# Patient Record
Sex: Female | Born: 1955 | Race: White | Hispanic: No | Smoking: Former smoker
Health system: Southern US, Community
[De-identification: ages and names within clinical notes are randomized; demographics above are authoritative.]

## PROBLEM LIST (undated history)

## (undated) DIAGNOSIS — Z8632 Personal history of gestational diabetes: Secondary | ICD-10-CM

## (undated) DIAGNOSIS — G4730 Sleep apnea, unspecified: Secondary | ICD-10-CM

## (undated) DIAGNOSIS — R29818 Other symptoms and signs involving the nervous system: Secondary | ICD-10-CM

## (undated) DIAGNOSIS — E871 Hypo-osmolality and hyponatremia: Secondary | ICD-10-CM

## (undated) DIAGNOSIS — D1720 Benign lipomatous neoplasm of skin and subcutaneous tissue of unspecified limb: Secondary | ICD-10-CM

## (undated) DIAGNOSIS — R7301 Impaired fasting glucose: Secondary | ICD-10-CM

## (undated) DIAGNOSIS — T7840XA Allergy, unspecified, initial encounter: Secondary | ICD-10-CM

## (undated) DIAGNOSIS — E785 Hyperlipidemia, unspecified: Secondary | ICD-10-CM

## (undated) DIAGNOSIS — E049 Nontoxic goiter, unspecified: Secondary | ICD-10-CM

## (undated) DIAGNOSIS — K219 Gastro-esophageal reflux disease without esophagitis: Secondary | ICD-10-CM

## (undated) DIAGNOSIS — I10 Essential (primary) hypertension: Secondary | ICD-10-CM

## (undated) DIAGNOSIS — Z8781 Personal history of (healed) traumatic fracture: Secondary | ICD-10-CM

## (undated) HISTORY — DX: Nontoxic goiter, unspecified: E04.9

## (undated) HISTORY — DX: Other symptoms and signs involving the nervous system: R29.818

## (undated) HISTORY — DX: Hyperlipidemia, unspecified: E78.5

## (undated) HISTORY — DX: Sleep apnea, unspecified: G47.30

## (undated) HISTORY — DX: Hypo-osmolality and hyponatremia: E87.1

## (undated) HISTORY — DX: Impaired fasting glucose: R73.01

## (undated) HISTORY — DX: Allergy, unspecified, initial encounter: T78.40XA

## (undated) HISTORY — DX: Personal history of (healed) traumatic fracture: Z87.81

## (undated) HISTORY — PX: TONSILLECTOMY: SUR1361

## (undated) HISTORY — PX: DILATION AND CURETTAGE OF UTERUS: SHX78

## (undated) HISTORY — DX: Gastro-esophageal reflux disease without esophagitis: K21.9

## (undated) HISTORY — DX: Benign lipomatous neoplasm of skin and subcutaneous tissue of unspecified limb: D17.20

## (undated) HISTORY — DX: Personal history of gestational diabetes: Z86.32

## (undated) HISTORY — DX: Essential (primary) hypertension: I10

---

## 2000-06-21 ENCOUNTER — Encounter: Payer: Self-pay | Admitting: *Deleted

## 2000-06-21 ENCOUNTER — Encounter: Admission: RE | Admit: 2000-06-21 | Discharge: 2000-06-21 | Payer: Self-pay | Admitting: *Deleted

## 2001-03-31 ENCOUNTER — Other Ambulatory Visit: Admission: RE | Admit: 2001-03-31 | Discharge: 2001-03-31 | Payer: Self-pay | Admitting: *Deleted

## 2004-11-30 ENCOUNTER — Emergency Department (HOSPITAL_COMMUNITY): Admission: EM | Admit: 2004-11-30 | Discharge: 2004-11-30 | Payer: Self-pay | Admitting: Emergency Medicine

## 2005-03-13 ENCOUNTER — Encounter: Admission: RE | Admit: 2005-03-13 | Discharge: 2005-03-13 | Payer: Self-pay | Admitting: Internal Medicine

## 2005-07-21 ENCOUNTER — Other Ambulatory Visit: Admission: RE | Admit: 2005-07-21 | Discharge: 2005-07-21 | Payer: Self-pay | Admitting: Obstetrics and Gynecology

## 2006-08-10 IMAGING — CT CT CHEST W/O CM
1 of 3 series · 15 of 31 positions shown, 19 images · non-contrast
Comparison: none

CLINICAL DATA: Former smoker.  Quit three months ago with family history of lung cancer. 
 CT LUNG SCREENING:
TECHNIQUE: Multidetector CT imaging of the chest was performed following the standard protocol without IV contrast.

[Series 4: recon 3: screening chest w/o · axial · non-contrast · 0.70mm/px · z∈[-285,-16]mm · 15 of 243 slices shown, 19 images]
[im 14/243  mediastinal]
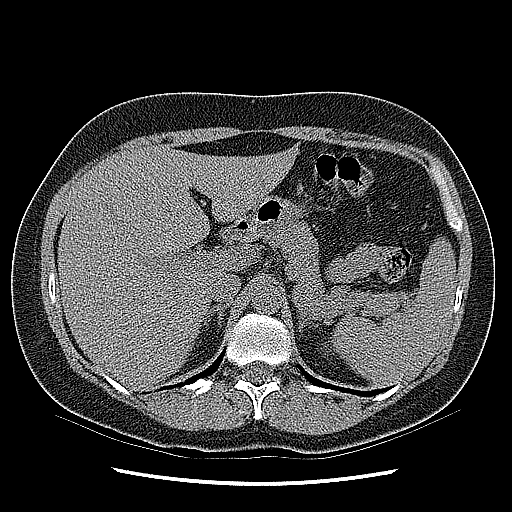
[im 14/243  lung]
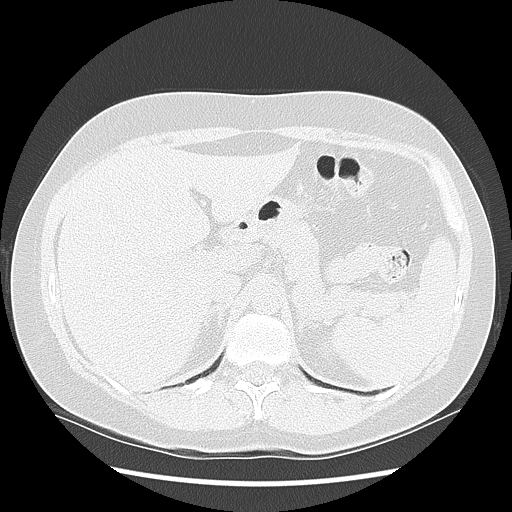
[im 27/243  lung]
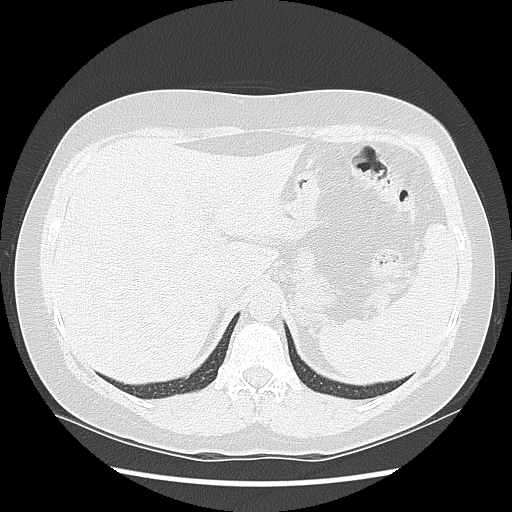
[im 54/243  lung]
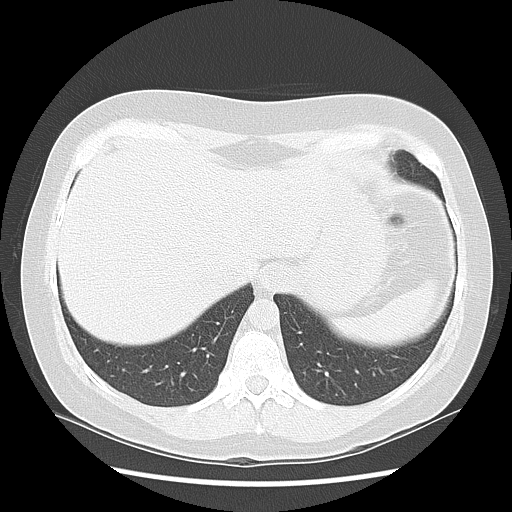
[im 68/243  lung]
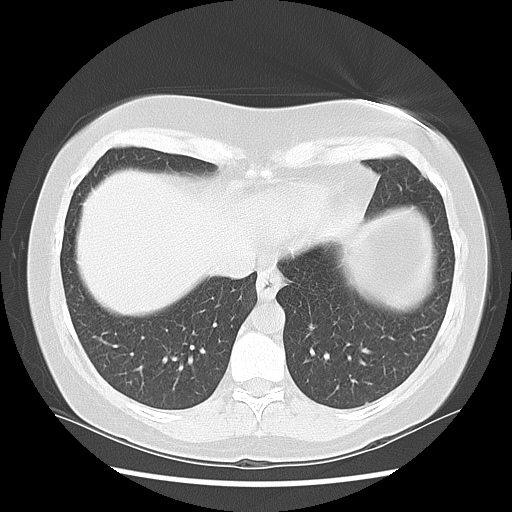
[im 81/243  mediastinal]
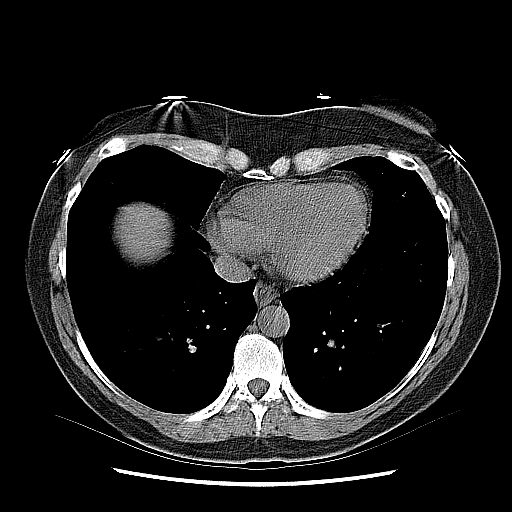
[im 81/243  lung]
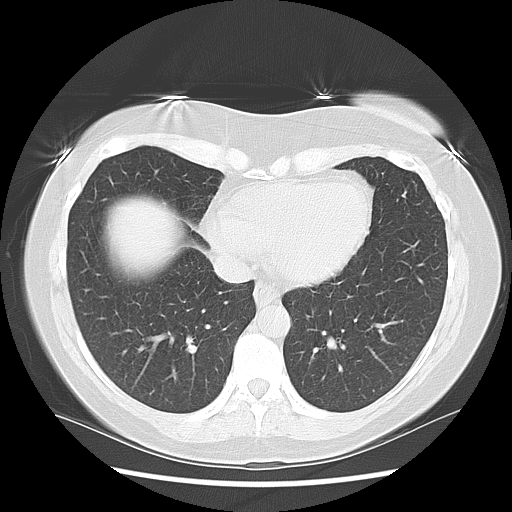
[im 95/243  lung]
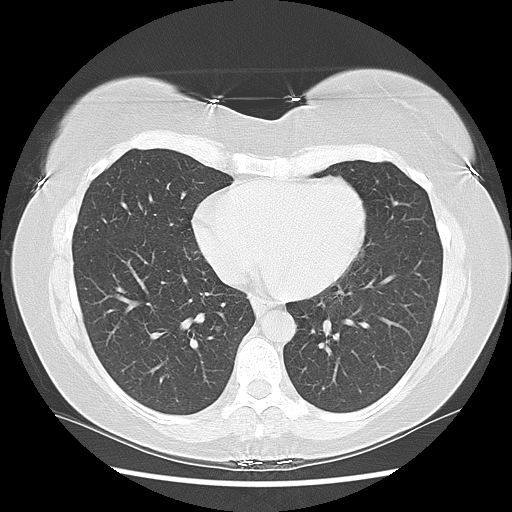
[im 107/243  lung]
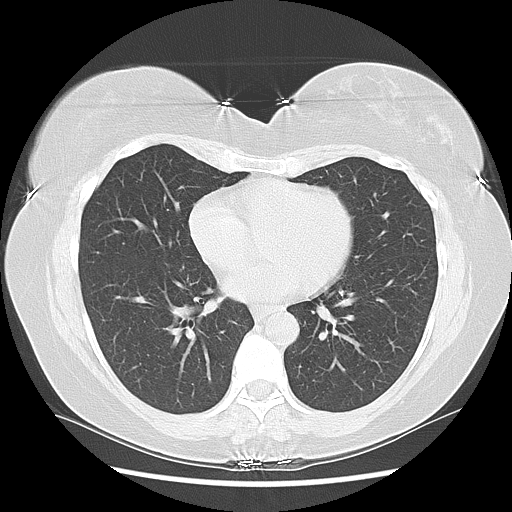
[im 122/243  lung]
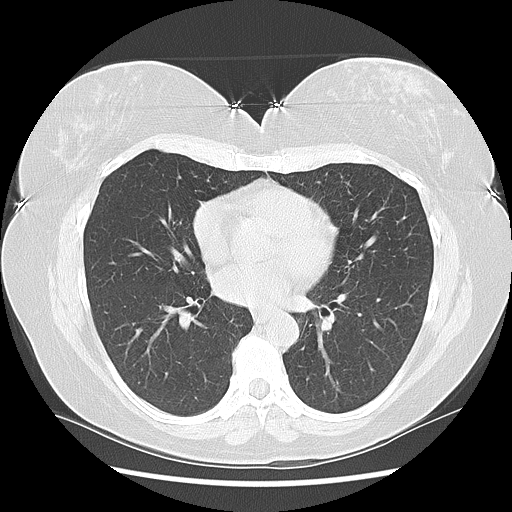
[im 135/243  mediastinal]
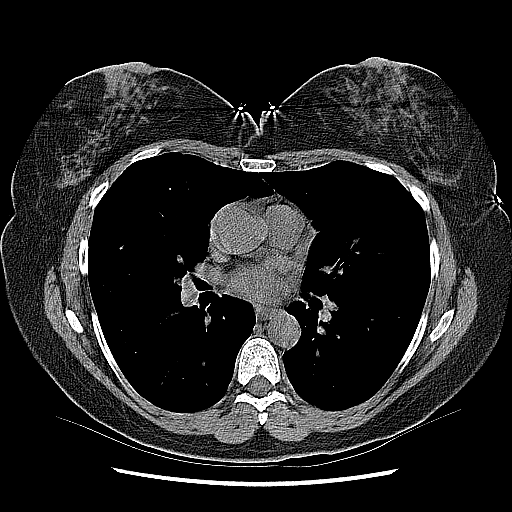
[im 135/243  lung]
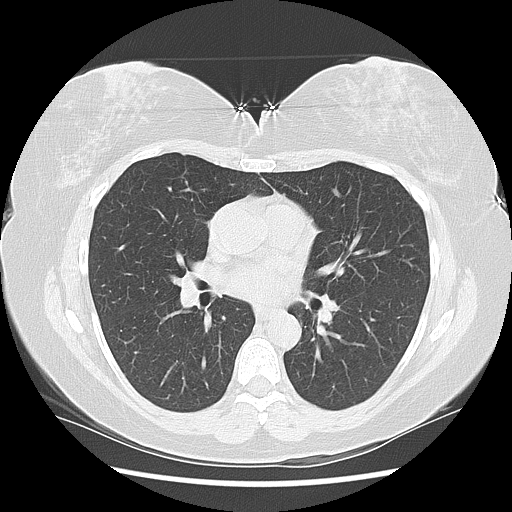
[im 148/243  lung]
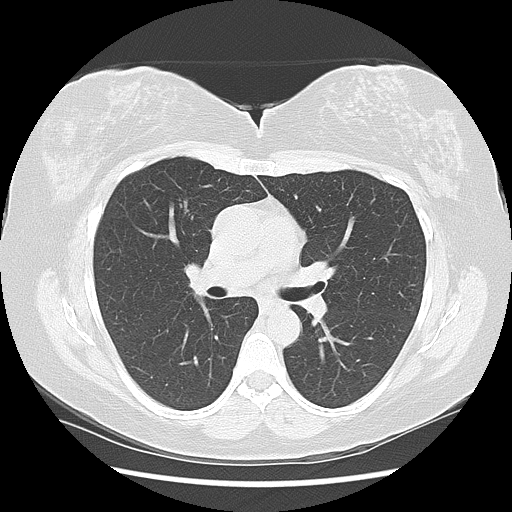
[im 162/243  lung]
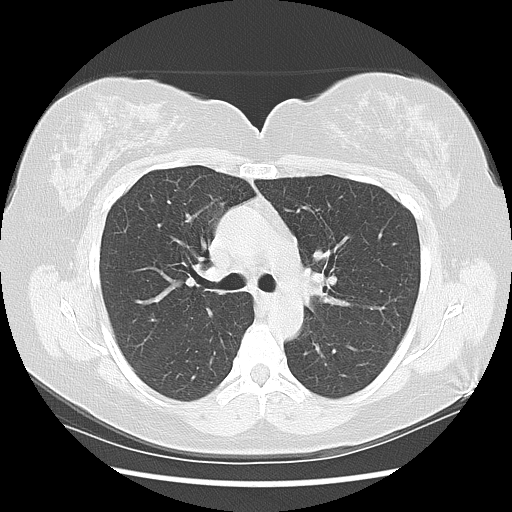
[im 175/243  lung]
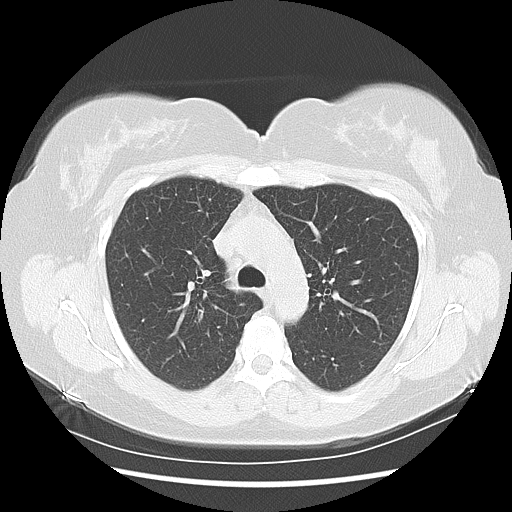
[im 202/243  mediastinal]
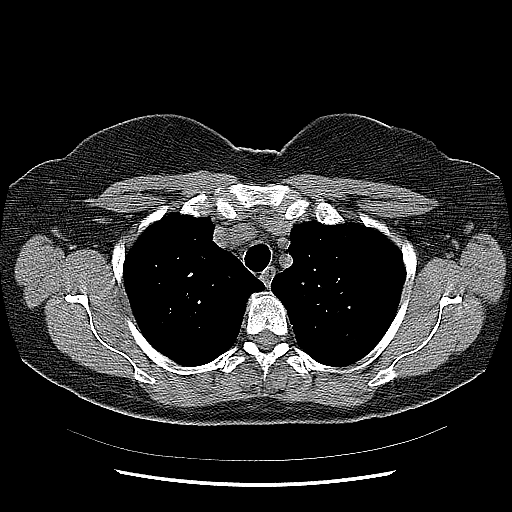
[im 202/243  lung]
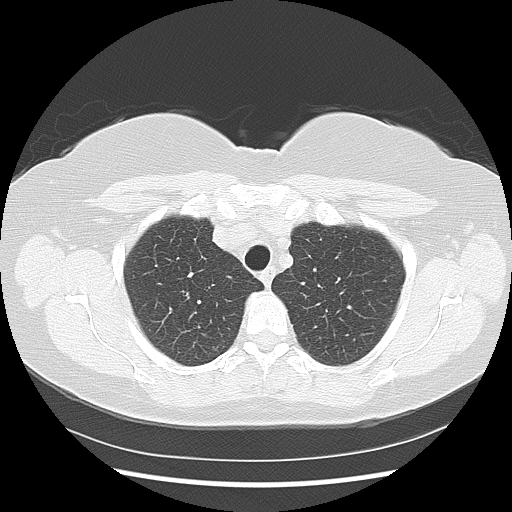
[im 216/243  lung]
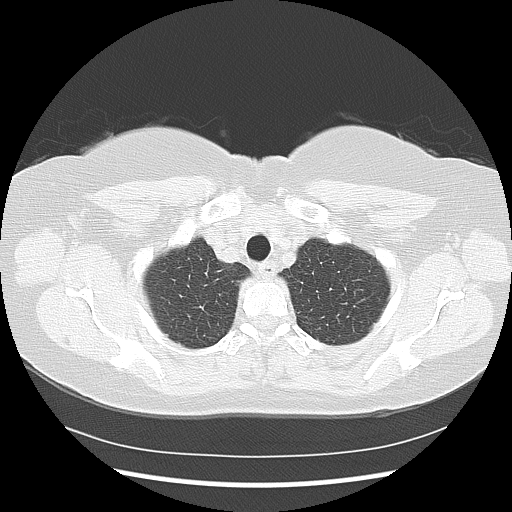
[im 229/243  lung]
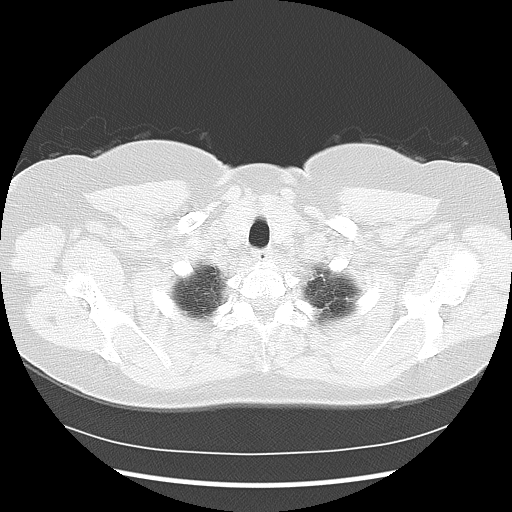

[15 of 31 positions shown; findings below may reference images not displayed]

FINDINGS: Area of curvilinear minimal scarring is seen at the anterior right middle lobe (images 36 ? 37) ? no further imaging follow-up deemed necessary.  The lungs are otherwise clear.  No significant mediastinal, hilar or axillary mass/adenopathy is seen with normal heart size.  Upper abdominal organs appear normal.  Old appearing ununited rib fracture (image 58) is seen at the right twelfth rib.
IMPRESSION: 1.  Ununited chronic appearing posterior right twelfth rib fracture deformity. 
 2.  Benign appearing slight fibrosis at the right middle lobe. 
 3.  Otherwise negative.

## 2007-01-18 ENCOUNTER — Other Ambulatory Visit: Admission: RE | Admit: 2007-01-18 | Discharge: 2007-01-18 | Payer: Self-pay | Admitting: Obstetrics and Gynecology

## 2007-10-03 LAB — HM MAMMOGRAPHY: HM Mammogram: NORMAL

## 2007-11-28 LAB — HM COLONOSCOPY

## 2009-01-14 ENCOUNTER — Other Ambulatory Visit: Admission: RE | Admit: 2009-01-14 | Discharge: 2009-01-14 | Payer: Self-pay | Admitting: Obstetrics and Gynecology

## 2010-03-06 ENCOUNTER — Other Ambulatory Visit: Admission: RE | Admit: 2010-03-06 | Discharge: 2010-03-06 | Payer: Self-pay | Admitting: Obstetrics and Gynecology

## 2010-05-12 ENCOUNTER — Encounter: Payer: Self-pay | Admitting: Obstetrics and Gynecology

## 2011-04-29 ENCOUNTER — Encounter: Payer: Self-pay | Admitting: *Deleted

## 2011-05-04 ENCOUNTER — Encounter: Payer: Self-pay | Admitting: Family Medicine

## 2011-05-04 ENCOUNTER — Ambulatory Visit (INDEPENDENT_AMBULATORY_CARE_PROVIDER_SITE_OTHER): Payer: 59 | Admitting: Family Medicine

## 2011-05-04 VITALS — BP 152/90 | HR 60 | Ht 65.0 in | Wt 169.0 lb

## 2011-05-04 DIAGNOSIS — I10 Essential (primary) hypertension: Secondary | ICD-10-CM | POA: Insufficient documentation

## 2011-05-04 DIAGNOSIS — Z8262 Family history of osteoporosis: Secondary | ICD-10-CM

## 2011-05-04 DIAGNOSIS — E049 Nontoxic goiter, unspecified: Secondary | ICD-10-CM | POA: Insufficient documentation

## 2011-05-04 DIAGNOSIS — F17200 Nicotine dependence, unspecified, uncomplicated: Secondary | ICD-10-CM | POA: Insufficient documentation

## 2011-05-04 DIAGNOSIS — Z23 Encounter for immunization: Secondary | ICD-10-CM

## 2011-05-04 DIAGNOSIS — Z8632 Personal history of gestational diabetes: Secondary | ICD-10-CM

## 2011-05-04 DIAGNOSIS — E7800 Pure hypercholesterolemia, unspecified: Secondary | ICD-10-CM | POA: Insufficient documentation

## 2011-05-04 NOTE — Progress Notes (Signed)
Chief complaint:  Patient presents to establish care.  Wants to get back on cholesterol and blood pressure medicine. Was having some neck pain since November but not currently  HPI: Patient presents to re-establish care.  Review of old records shows that I haven't seen her probably since 2009 (fall).  She saw Dr. Jillyn Hidden in October 2011, but wasn't taking meds at that time, no was she restarted on her medications.  Previously took meds for blood pressure and cholesterol.  HTN:  BP at work runs 135-138/75-90.  Previously used catapres patch, but had reaction to the adhesive, and was having dizziness in the mornings, which is why she stopped taking this medication.  Heartburn, seems to be related to gluten.  Since November, she had ongoing problems with heartburn.  She gave up all gluten on January 1st, feeling much better.  Lost weight, no further heartburn.  Hyperlipidemia--last check 01/2010 LDL 157, total chol 260, HDL 69, TG 165, ratio 3.79.  In the past, previously took Pravastatin with good results and no side effects. Has had dietary change since going gluten free January 1st.  Eats red meat every 2 weeks, 2 eggs/weekend. +cheese, but cut back significantly in January.  Past Medical History  Diagnosis Date  . Hypertension   . GERD (gastroesophageal reflux disease)   . Allergy   . Dyslipidemia     hypercholesterolemia  . Lipoma of axilla     h/o left  . 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 Hx gestational diabetes   . Low sodium levels     history of low sodium with diuretics in the past.  . Goiter     u/s 07/2004, generalized enlargement without cysts or nodules; normal TSH    Past Surgical History  Procedure Date  . Tonsillectomy     History   Social History  . Marital Status: Divorced    Spouse Name: N/A    Number of Children: 1  . Years of Education: N/A   Occupational History  . architect    Social History Main Topics    . Smoking status: Current Everyday Smoker -- 0.1 packs/day for 41 years    Types: Cigarettes  . Smokeless tobacco: Not on file   Comment: 1 cigarette daily, at bedtime  . Alcohol Use: Yes     2-3 glasses of red wine per day.  . Drug Use: No  . Sexually Active: Not on file   Other Topics Concern  . Not on file   Social History Narrative   Lives with her 102 year old son; no pets    Family History  Problem Relation Age of Onset  . Osteoporosis Mother   . Cancer Father 46    osteosarcoma, metastasized to lung  . Diabetes Maternal Uncle   . Cancer Paternal Uncle 50    colon  . Cancer Maternal Grandfather     throat  . Breast cancer Paternal Grandmother 54  . Crohn's disease Cousin   . Psoriasis Cousin     Current outpatient prescriptions:aspirin 81 MG tablet, Take 160 mg by mouth daily., Disp: , Rfl: ;  Calcium Carbonate (CALCIUM 600 PO), Take 1 tablet by mouth daily., Disp: , Rfl: ;  Cholecalciferol (VITAMIN D) 1000 UNITS capsule, Take 1,000 Units by mouth daily., Disp: , Rfl: ;  Multiple Vitamin (ANTI-OXIDANT PO), Take 1 tablet by mouth daily., Disp: , Rfl:  Multiple Vitamins-Minerals (ANTIOXIDANT VITAMINS PO),  Take 1 tablet by mouth daily., Disp: , Rfl: ;  vitamin C (ASCORBIC ACID) 500 MG tablet, Take 500 mg by mouth daily., Disp: , Rfl: ;  vitamin E 100 UNIT capsule, Take 100 Units by mouth daily., Disp: , Rfl:   Allergies  Allergen Reactions  . Codeine Other (See Comments)    Would make her start her period. Also just affected her body poorly so she just does not take it.  Marland Kitchen Hctz (Hydrochlorothiazide) Other (See Comments)    Hyponatremia   . Penicillins Rash   ROS: Denies headaches, dizziness, chest pain, palpitations.  Reflux has resolved, bowels are normal.  Denies fatigue.  Sore throat over the weekend with some swollen glands, feeling better today.  No sneezing, fevers, cough. Denies urinary complaints, joint complaints, skin rashes, bleeding, or other  problems.  Had colonoscopy at age 83.  Due for mammogram, and sees Dr. Gerald Leitz for her GYN, last fall 2011.  Had CPE with Dr. Jillyn Hidden in 01/2010  PHYSICAL EXAM: BP 152/90  Pulse 60  Ht 5\' 5"  (1.651 m)  Wt 169 lb (76.658 kg)  BMI 28.12 kg/m2 Well developed, pleasant female, in no distress HEENT:  PERRL, EOMI, conjunctiva clear, OP clear Neck: mildly enlarged thyroid, without mass or nodules Heart: regular rate and rhythm without murmur Lungs: clear bilaterally Back: no spine or CVA tenderness Abdomen: soft, nontender, no organomegaly or mass Extremities: no edema, 2+ pulse Skin: no rash Psych: normal mood, affect, hygiene and grooming  ASSESSMENT/PLAN:  1. Need for prophylactic vaccination and inoculation against influenza  Flu vaccine greater than or equal to 3yo preservative free IM  2. History of gestational diabetes  Hemoglobin A1c  3. Pure hypercholesterolemia  Lipid panel  4. Essential hypertension, benign  Comprehensive metabolic panel  5. Goiter  TSH  6. Family history of osteoporosis  Vitamin D 25 hydroxy  7. Tobacco use disorder     HTN--Keep BP log.  Fax/mail in 1 month.  Low sodium diet and daily exercise reviewed. Hyperlipidemia--given recent dietary changes, will wait at least 4-6 weeks before checking labs.  Restart pravastatin only if indicated.  Schedule CPE for 2 months, with labs prior.  F/u sooner if BP's remain elevated.  They seem to be lower at home on a regular basis than what it was here today.

## 2011-05-04 NOTE — Patient Instructions (Addendum)
-Keep BP log.  Fax/mail in 1 month.  Low sodium diet and daily exercise Please quit smoking   2 Gram Low Sodium Diet A 2 gram sodium diet restricts the amount of sodium in the diet to no more than 2 g or 2000 mg daily. Limiting the amount of sodium is often used to help lower blood pressure. It is important if you have heart, liver, or kidney problems. Many foods contain sodium for flavor and sometimes as a preservative. When the amount of sodium in a diet needs to be low, it is important to know what to look for when choosing foods and drinks. The following includes some information and guidelines to help make it easier for you to adapt to a low sodium diet. QUICK TIPS  Do not add salt to food.   Avoid convenience items and fast food.   Choose unsalted snack foods.   Buy lower sodium products, often labeled as "lower sodium" or "no salt added."   Check food labels to learn how much sodium is in 1 serving.   When eating at a restaurant, ask that your food be prepared with less salt or none, if possible.  READING FOOD LABELS FOR SODIUM INFORMATION The nutrition facts label is a good place to find how much sodium is in foods. Look for products with no more than 500 to 600 mg of sodium per meal and no more than 150 mg per serving. Remember that 2 g = 2000 mg. The food label may also list foods as:  Sodium-free: Less than 5 mg in a serving.   Very low sodium: 35 mg or less in a serving.   Low-sodium: 140 mg or less in a serving.   Light in sodium: 50% less sodium in a serving. For example, if a food that usually has 300 mg of sodium is changed to become light in sodium, it will have 150 mg of sodium.   Reduced sodium: 25% less sodium in a serving. For example, if a food that usually has 400 mg of sodium is changed to reduced sodium, it will have 300 mg of sodium.  CHOOSING FOODS Grains  Avoid: Salted crackers and snack items. Some cereals, including instant hot cereals. Bread stuffing  and biscuit mixes. Seasoned rice or pasta mixes.   Choose: Unsalted snack items. Low-sodium cereals, oats, puffed wheat and rice, shredded wheat. English muffins and bread. Pasta.  Meats  Avoid: Salted, canned, smoked, spiced, pickled meats, including fish and poultry. Bacon, ham, sausage, cold cuts, hot dogs, anchovies.   Choose: Low-sodium canned tuna and salmon. Fresh or frozen meat, poultry, and fish.  Dairy  Avoid: Processed cheese and spreads. Cottage cheese. Buttermilk and condensed milk. Regular cheese.   Choose: Milk. Low-sodium cottage cheese. Yogurt. Sour cream. Low-sodium cheese.  Fruits and Vegetables  Avoid: Regular canned vegetables. Regular canned tomato sauce and paste. Frozen vegetables in sauces. Olives. Rosita Fire. Relishes. Sauerkraut.   Choose: Low-sodium canned vegetables. Low-sodium tomato sauce and paste. Frozen or fresh vegetables. Fresh and frozen fruit.  Condiments  Avoid: Canned and packaged gravies. Worcestershire sauce. Tartar sauce. Barbecue sauce. Soy sauce. Steak sauce. Ketchup. Onion, garlic, and table salt. Meat flavorings and tenderizers.   Choose: Fresh and dried herbs and spices. Low-sodium varieties of mustard and ketchup. Lemon juice. Tabasco sauce. Horseradish.  SAMPLE 2 GRAM SODIUM MEAL PLAN Breakfast / Sodium (mg)  1 cup low-fat milk / 143 mg   2 slices whole-wheat toast / 270 mg   1 tbs  heart-healthy margarine / 153 mg   1 hard-boiled egg / 139 mg   1 small orange / 0 mg  Lunch / Sodium (mg)  1 cup raw carrots / 76 mg    cup hummus / 298 mg   1 cup low-fat milk / 143 mg    cup red grapes / 2 mg   1 whole-wheat pita bread / 356 mg  Dinner / Sodium (mg)  1 cup whole-wheat pasta / 2 mg   1 cup low-sodium tomato sauce / 73 mg   3 oz lean ground beef / 57 mg   1 small side salad (1 cup raw spinach leaves,  cup cucumber,  cup yellow bell pepper) with 1 tsp olive oil and 1 tsp red wine vinegar / 25 mg  Snack / Sodium  (mg)  1 container low-fat vanilla yogurt / 107 mg   3 graham cracker squares / 127 mg  Nutrient Analysis  Calories: 2033   Protein: 77 g   Carbohydrate: 282 g   Fat: 72 g   Sodium: 1971 mg  Document Released: 04/06/2005 Document Revised: 12/17/2010 Document Reviewed: 07/08/2009 [facility] Patient Information 2012 [place], [place].

## 2011-06-30 ENCOUNTER — Other Ambulatory Visit: Payer: 59

## 2011-07-02 ENCOUNTER — Encounter: Payer: 59 | Admitting: Family Medicine

## 2011-08-03 ENCOUNTER — Other Ambulatory Visit: Payer: 59

## 2011-08-05 ENCOUNTER — Encounter: Payer: 59 | Admitting: Family Medicine

## 2012-03-30 ENCOUNTER — Telehealth: Payer: Self-pay | Admitting: Family Medicine

## 2012-04-01 NOTE — Telephone Encounter (Signed)
Patient still has future lab orders in system (ordered 04/2011)--these, however, will expire in January.  Please renew (change expiration date) so orders will still be in system prior to her CPE (a year late)

## 2012-04-04 NOTE — Telephone Encounter (Signed)
Orders extended.

## 2012-04-20 LAB — HM MAMMOGRAPHY: HM Mammogram: NORMAL

## 2012-05-30 ENCOUNTER — Other Ambulatory Visit: Payer: 59

## 2012-05-30 DIAGNOSIS — E7800 Pure hypercholesterolemia, unspecified: Secondary | ICD-10-CM

## 2012-05-30 DIAGNOSIS — Z8632 Personal history of gestational diabetes: Secondary | ICD-10-CM

## 2012-05-30 DIAGNOSIS — I10 Essential (primary) hypertension: Secondary | ICD-10-CM

## 2012-05-30 DIAGNOSIS — E049 Nontoxic goiter, unspecified: Secondary | ICD-10-CM

## 2012-05-30 LAB — COMPREHENSIVE METABOLIC PANEL
BUN: 14 mg/dL (ref 6–23)
CO2: 24 mEq/L (ref 19–32)
Calcium: 9.8 mg/dL (ref 8.4–10.5)
Creat: 0.73 mg/dL (ref 0.50–1.10)
Glucose, Bld: 113 mg/dL — ABNORMAL HIGH (ref 70–99)
Total Bilirubin: 0.5 mg/dL (ref 0.3–1.2)

## 2012-05-30 LAB — LIPID PANEL
Cholesterol: 270 mg/dL — ABNORMAL HIGH (ref 0–200)
HDL: 70 mg/dL (ref >39)
Triglycerides: 195 mg/dL — ABNORMAL HIGH (ref <150)

## 2012-05-30 LAB — HEMOGLOBIN A1C
Hgb A1c MFr Bld: 5.6 % (ref <5.7)
Mean Plasma Glucose: 114 mg/dL (ref <117)

## 2012-06-01 ENCOUNTER — Encounter: Payer: 59 | Admitting: Family Medicine

## 2012-06-01 ENCOUNTER — Encounter: Payer: Self-pay | Admitting: Family Medicine

## 2012-06-01 ENCOUNTER — Ambulatory Visit (INDEPENDENT_AMBULATORY_CARE_PROVIDER_SITE_OTHER): Payer: 59 | Admitting: Family Medicine

## 2012-06-01 VITALS — BP 160/80 | HR 80 | Temp 98.1°F | Ht 66.0 in | Wt 170.0 lb

## 2012-06-01 DIAGNOSIS — K219 Gastro-esophageal reflux disease without esophagitis: Secondary | ICD-10-CM | POA: Insufficient documentation

## 2012-06-01 DIAGNOSIS — F17200 Nicotine dependence, unspecified, uncomplicated: Secondary | ICD-10-CM

## 2012-06-01 DIAGNOSIS — E7800 Pure hypercholesterolemia, unspecified: Secondary | ICD-10-CM

## 2012-06-01 DIAGNOSIS — I10 Essential (primary) hypertension: Secondary | ICD-10-CM

## 2012-06-01 DIAGNOSIS — Z0000 Encounter for general adult medical examination without abnormal findings: Secondary | ICD-10-CM

## 2012-06-01 DIAGNOSIS — R7301 Impaired fasting glucose: Secondary | ICD-10-CM

## 2012-06-01 LAB — POCT URINALYSIS DIPSTICK
Bilirubin, UA: NEGATIVE
Glucose, UA: NEGATIVE
Ketones, UA: NEGATIVE
Leukocytes, UA: NEGATIVE
Nitrite, UA: NEGATIVE

## 2012-06-01 NOTE — Progress Notes (Signed)
Chief Complaint  Patient presents with  . Annual Exam    nonfasting annual exam, sees Dr.Gretchen Renaldo Fiddler (appt next Mon). Would like flu shot, does have some upper respiratory issues going on so I wasn't sure if she could have. No major concerns. Labs done 05/30/12. Did not do vision as she just had eye exam.   Laura Payne is a 57 y.o. female who presents for a complete physical.  She has the following concerns:  Follow up on labs done prior to appointment.  She admits to eating cheese with most meals. Only has red meat about once a week or every 2 weeks. Cooks with olive oil.  Drinks 2-3 glasses of wine daily.  Has cut back on her Diet coke intake.  URI:  Started 3 days ago with malaise, head congestion, sinus pressure, ear pressure.  Nasal mucus is clear. Feeling better today than yesterday. BP's at home run 130/85 at home.  Immunization History  Administered Date(s) Administered  . Influenza Split 05/04/2011  . Tdap 10/19/2007   Last Pap smear: 1.5 years ago, has scheduled Last mammogram: last month Last colonoscopy: age 63 Last DEXA:  Never Dentist: twice yearly Ophtho: yearly Exercise:  On weekends (2x/week).  Past Medical History  Diagnosis Date  . Hypertension   . GERD (gastroesophageal reflux disease)   . Allergy   . Dyslipidemia     hypercholesterolemia  . Lipoma of axilla     h/o left  . 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 Hx gestational diabetes   . Low sodium levels     history of low sodium with diuretics in the past.  . Goiter     u/s 07/2004, generalized enlargement without cysts or nodules; normal TSH    Past Surgical History  Procedure Laterality Date  . Tonsillectomy      History   Social History  . Marital Status: Divorced    Spouse Name: N/A    Number of Children: 1  . Years of Education: N/A   Occupational History  . architect    Social History Main Topics  . Smoking status: Current  Every Day Smoker -- 0.10 packs/day for 41 years    Types: Cigarettes  . Smokeless tobacco: Not on file     Comment: 1 cigarette daily, at bedtime  . Alcohol Use: Yes     Comment: 2-3 glasses of red wine per day.  . Drug Use: No  . Sexually Active: Not Currently   Other Topics Concern  . Not on file   Social History Narrative   Lives with her 70 year old son; no pets    Family History  Problem Relation Age of Onset  . Osteoporosis Mother   . Dementia Mother   . Cancer Father 70    osteosarcoma, metastasized to lung  . Diabetes Maternal Uncle   . Cancer Paternal Uncle 50    colon  . Cancer Maternal Grandfather     throat  . Breast cancer Paternal Grandmother 50  . Crohn's disease Cousin   . Psoriasis Cousin   . Arthritis Cousin     rheumatoid    Current outpatient prescriptions:aspirin 81 MG tablet, Take 81 mg by mouth daily. , Disp: , Rfl: ;  Cholecalciferol (VITAMIN D) 1000 UNITS capsule, Take 1,000 Units by mouth daily., Disp: , Rfl: ;  Multiple Vitamin (ANTI-OXIDANT PO), Take 1 tablet by mouth daily., Disp: ,  Rfl: ;  ranitidine (ZANTAC) 150 MG tablet, Take 150 mg by mouth as needed for heartburn., Disp: , Rfl:  vitamin C (ASCORBIC ACID) 500 MG tablet, Take 500 mg by mouth daily., Disp: , Rfl: ;  vitamin E 100 UNIT capsule, Take 100 Units by mouth daily., Disp: , Rfl: ;  Calcium Carbonate (CALCIUM 600 PO), Take 1 tablet by mouth daily., Disp: , Rfl:   Allergies  Allergen Reactions  . Codeine Other (See Comments)    Would make her start her period. Also just affected her body poorly so she just does not take it.  Marland Kitchen Hctz (Hydrochlorothiazide) Other (See Comments)    Hyponatremia   . Latex Rash  . Penicillins Rash    ROS:  The patient denies anorexia, weight changes,vision changes, decreased hearing, ear pain, breast concerns, chest pain, dizziness, syncope, dyspnea on exertion, swelling, nausea, vomiting, diarrhea, constipation, abdominal pain, melena, hematochezia,  hematuria, incontinence, dysuria, vaginal bleeding, discharge, odor or itch, genital lesions, joint pains, numbness, tingling, weakness, tremor, suspicious skin lesions, depression, anxiety, abnormal bleeding/bruising, or enlarged lymph nodes.  +URI, no fever (see HPI) Arms sometimes ache (x 1 year), some numbness with certain sleep positions. +heartburn every 3 days, relieved by zantac 150 Noticing some palpitations recently--? If because of her mother's afib, anxiety/worry about this and about her heart. Postmenopausal, no bleeding, +hot flashes (improved)  PHYSICAL EXAM: BP 160/80  Pulse 80  Temp(Src) 98.1 F (36.7 C) (Oral)  Ht 5\' 6"  (1.676 m)  Wt 170 lb (77.111 kg)  BMI 27.45 kg/m2  General Appearance:    Alert, cooperative, no distress, appears stated age  Head:    Normocephalic, without obvious abnormality, atraumatic  Eyes:    PERRL, conjunctiva/corneas clear, EOM's intact, fundi    benign  Ears:    Normal TM's and external ear canals  Nose:   Nares normal, mucosa moderately edematous, no erythema, no purulent drainage; mild maxillary sinus tenderness  Throat:   Lips, mucosa, and tongue normal; teeth and gums normal  Neck:   Supple, no lymphadenopathy;  thyroid:  no   enlargement/tenderness/nodules; no carotid   bruit or JVD  Back:    Spine nontender, no curvature, ROM normal, no CVA     tenderness  Lungs:     Clear to auscultation bilaterally without wheezes, rales or     ronchi; respirations unlabored  Chest Wall:    No tenderness or deformity   Heart:    Regular rate and rhythm, S1 and S2 normal, no murmur, rub   or gallop  Breast Exam:    Deferred to GYN  Abdomen:     Soft, non-tender, nondistended, normoactive bowel sounds,    no masses, no hepatosplenomegaly  Genitalia:    Deferred to GYN     Extremities:   No clubbing, cyanosis or edema  Pulses:   2+ and symmetric all extremities  Skin:   Skin color, texture, turgor normal, no rashes or lesions  Lymph nodes:    Cervical, supraclavicular, and axillary nodes normal  Neurologic:   CNII-XII intact, normal strength, sensation and gait; reflexes 2+ and symmetric throughout          Psych:   Normal mood, affect, hygiene and grooming.    Lab Results  Component Value Date   HGBA1C 5.6 05/30/2012     Chemistry      Component Value Date/Time   NA 141 05/30/2012 0952   K 4.2 05/30/2012 0952   CL 107 05/30/2012 1610  CO2 24 05/30/2012 0952   BUN 14 05/30/2012 0952   CREATININE 0.73 05/30/2012 0952      Component Value Date/Time   CALCIUM 9.8 05/30/2012 0952   ALKPHOS 73 05/30/2012 0952   AST 16 05/30/2012 0952   ALT 25 05/30/2012 0952   BILITOT 0.5 05/30/2012 0952     Glucose 113  Lab Results  Component Value Date   CHOL 270* 05/30/2012   HDL 70 05/30/2012   LDLCALC 161* 05/30/2012   TRIG 195* 05/30/2012   CHOLHDL 3.9 05/30/2012    ASSESSMENT/PLAN:  1. Routine general medical examination at a health care facility  POCT Urinalysis Dipstick   POCT Urinalysis Dipstick   CANCELED: Visual acuity screening  2. Pure hypercholesterolemia  Lipid panel   Lipid panel  3. Tobacco use disorder    4. Essential hypertension, benign    5. Impaired fasting glucose  Hemoglobin A1c   Hemoglobin A1c   Glucose, random  6. GERD (gastroesophageal reflux disease)     Labs reviewed in detail with patient.  Dyslipidemia--counseled regarding diet at length.  Decrease cheese and wine intake, low cholesterol diet reviewed. Impaired fasting glucose--counseled regarding diet, exercise, weight loss  URI--neti-pot/sinus rinses, mucinex, decongestants sparingly with BP monitored vs coricidin.  No evidence of bacterial infection currently. GERD--counseled re: risks of untreated reflux, and behavioral/dietary modifications, as well as medications to treat.  Pneumovax recommended for smokers.  Will give in 6 months at f/u visit (unless has quit by then).  Elevated blood pressure--continue monitoring at home.  Bring list of BP's  (and monitor) to next visit.  Return sooner if BP's remain >140/90.  Exercise, weight loss and low sodium diet.  Discussed monthly self breast exams and yearly mammograms after the age of 33; at least 30 minutes of aerobic activity at least 5 days/week; proper sunscreen use reviewed; healthy diet, including goals of calcium and vitamin D intake and alcohol recommendations (less than or equal to 1 drink/day) reviewed; regular seatbelt use; changing batteries in smoke detectors.  Immunization recommendations discussed--pneumovax, flu shots yearly (not given today due to illness, and toward end of season, to get in the fall).  Colonoscopy recommendations reviewed, UTD   F/u 6 months with fasting labs prior

## 2012-06-01 NOTE — Patient Instructions (Addendum)
HEALTH MAINTENANCE RECOMMENDATIONS:  It is recommended that you get at least 30 minutes of aerobic exercise at least 5 days/week (for weight loss, you may need as much as 60-90 minutes). This can be any activity that gets your heart rate up. This can be divided in 10-15 minute intervals if needed, but try and build up your endurance at least once a week.  Weight bearing exercise is also recommended twice weekly.  Eat a healthy diet with lots of vegetables, fruits and fiber.  "Colorful" foods have a lot of vitamins (ie green vegetables, tomatoes, red peppers, etc).  Limit sweet tea, regular sodas and alcoholic beverages, all of which has a lot of calories and sugar.  Up to 1 alcoholic drink daily may be beneficial for women (unless trying to lose weight, watch sugars).  Drink a lot of water.  Calcium recommendations are 1200-1500 mg daily (1500 mg for postmenopausal women or women without ovaries), and vitamin D 1000 IU daily.  This should be obtained from diet and/or supplements (vitamins), and calcium should not be taken all at once, but in divided doses.  Monthly self breast exams and yearly mammograms for women over the age of 109 is recommended.  Sunscreen of at least SPF 30 should be used on all sun-exposed parts of the skin when outside between the hours of 10 am and 4 pm (not just when at beach or pool, but even with exercise, golf, tennis, and yard work!)  Use a sunscreen that says "broad spectrum" so it covers both UVA and UVB rays, and make sure to reapply every 1-2 hours.  Remember to change the batteries in your smoke detectors when changing your clock times in the spring and fall.  Use your seat belt every time you are in a car, and please drive safely and not be distracted with cell phones and texting while driving.  Cut back on alcohol, cheese.  Exercise daily, weight loss recommended.  Plan to recheck labs in 6 months.  URI/sinus infection--neti-pot/sinus rinses, mucinex,  decongestants sparingly with BP monitored vs coricidin.  Diet for Gastroesophageal Reflux Disease, Adult Reflux (acid reflux) is when acid from your stomach flows up into the esophagus. When acid comes in contact with the esophagus, the acid causes irritation and soreness (inflammation) in the esophagus. When reflux happens often or so severely that it causes damage to the esophagus, it is called gastroesophageal reflux disease (GERD). Nutrition therapy can help ease the discomfort of GERD. FOODS OR DRINKS TO AVOID OR LIMIT  Smoking or chewing tobacco. Nicotine is one of the most potent stimulants to acid production in the gastrointestinal tract.  Caffeinated and decaffeinated coffee and black tea.  Regular or low-calorie carbonated beverages or energy drinks (caffeine-free carbonated beverages are allowed).   Strong spices, such as black pepper, white pepper, red pepper, cayenne, curry powder, and chili powder.  Peppermint or spearmint.  Chocolate.  High-fat foods, including meats and fried foods. Extra added fats including oils, butter, salad dressings, and nuts. Limit these to less than 8 tsp per day.  Fruits and vegetables if they are not tolerated, such as citrus fruits or tomatoes.  Alcohol.  Any food that seems to aggravate your condition. If you have questions regarding your diet, call your caregiver or a registered dietitian. OTHER THINGS THAT MAY HELP GERD INCLUDE:   Eating your meals slowly, in a relaxed setting.  Eating 5 to 6 small meals per day instead of 3 large meals.  Eliminating food for a  period of time if it causes distress.  Not lying down until 3 hours after eating a meal.  Keeping the head of your bed raised 6 to 9 inches (15 to 23 cm) by using a foam wedge or blocks under the legs of the bed. Lying flat may make symptoms worse.  Being physically active. Weight loss may be helpful in reducing reflux in overweight or obese adults.  Wear loose fitting  clothing EXAMPLE MEAL PLAN This meal plan is approximately 2,000 calories based on https://www.[facility].org/ meal planning guidelines. Breakfast   cup cooked oatmeal.  1 cup strawberries.  1 cup low-fat milk.  1 oz almonds. Snack  1 cup cucumber slices.  6 oz yogurt (made from low-fat or fat-free milk). Lunch  2 slice whole-wheat bread.  2 oz sliced [place].  2 tsp mayonnaise.  1 cup blueberries.  1 cup snap peas. Snack  6 whole-wheat crackers.  1 oz string cheese. Dinner   cup brown rice.  1 cup mixed veggies.  1 tsp olive oil.  3 oz grilled fish. Document Released: 04/06/2005 Document Revised: 06/29/2011 Document Reviewed: 02/20/2011 [facility] Patient Information 2013 [place], [place].

## 2012-06-04 ENCOUNTER — Other Ambulatory Visit: Payer: Self-pay

## 2012-11-21 ENCOUNTER — Other Ambulatory Visit: Payer: No Typology Code available for payment source

## 2012-11-21 DIAGNOSIS — R7301 Impaired fasting glucose: Secondary | ICD-10-CM

## 2012-11-21 DIAGNOSIS — E7800 Pure hypercholesterolemia, unspecified: Secondary | ICD-10-CM

## 2012-11-21 LAB — LIPID PANEL
Cholesterol: 255 mg/dL — ABNORMAL HIGH (ref 0–200)
Triglycerides: 280 mg/dL — ABNORMAL HIGH (ref <150)

## 2012-11-22 LAB — HEMOGLOBIN A1C: Mean Plasma Glucose: 108 mg/dL (ref <117)

## 2012-11-24 ENCOUNTER — Encounter: Payer: Self-pay | Admitting: Family Medicine

## 2012-11-24 ENCOUNTER — Ambulatory Visit (INDEPENDENT_AMBULATORY_CARE_PROVIDER_SITE_OTHER): Payer: No Typology Code available for payment source | Admitting: Family Medicine

## 2012-11-24 VITALS — BP 154/84 | HR 84 | Ht 65.0 in | Wt 174.0 lb

## 2012-11-24 DIAGNOSIS — R7301 Impaired fasting glucose: Secondary | ICD-10-CM

## 2012-11-24 DIAGNOSIS — I10 Essential (primary) hypertension: Secondary | ICD-10-CM

## 2012-11-24 DIAGNOSIS — F1010 Alcohol abuse, uncomplicated: Secondary | ICD-10-CM

## 2012-11-24 DIAGNOSIS — F17200 Nicotine dependence, unspecified, uncomplicated: Secondary | ICD-10-CM

## 2012-11-24 DIAGNOSIS — E7800 Pure hypercholesterolemia, unspecified: Secondary | ICD-10-CM

## 2012-11-24 MED ORDER — LISINOPRIL 10 MG PO TABS: 10.0000 mg | ORAL_TABLET | Freq: Every day | ORAL | Status: DC

## 2012-11-24 NOTE — Patient Instructions (Addendum)
Reduce wine intake to just 1 glass per day.  If you feel you cannot control this, then only drink socially, and do not have wine in the house.  Develop other relaxation techniques (yoga, mediation, crocheting).  Try and exercise at least 30 minutes daily.  Cutting back on the wine will help you lose weight, reduce triglycerides, reduce blood sugar, and potentially reduce blood pressure.  Try taking omega-3 fish oil 3000 mg daily.  This also helps lower triglycerides.  Start lisinopril 10mg  once daily.  Continue low sodium diet.  Call us if you develop a terrible cough, and can't tolerate the medication  Bring list of BP's to visit (and monitor to be checked, if desired). Will do EKG at next appointment

## 2012-11-24 NOTE — Progress Notes (Signed)
Chief Complaint  Patient presents with  . Follow-up    med check. States that she may need bp meds and/or chol medication.   Hypertension follow-up:  Blood pressures were only checked a few times, and ranged in the high 140's/90's. She thinks she needs to be on medication (again) as they are remaining high.  She tried clonidine patch (when also having a lot of hot flashes) and thinks HCTZ in the past, recall having problems with low potassium.  Denies dizziness, headaches. Sometimes will get a fleeting, short-lived pain in the chest while driving, sometimes at night when she can't fall asleep.  Denies any exertional chest pain.  Not getting any regular exercise. Not bothered by reflux much  Tobacco use:  She is only smoking 1 cigarette/week.  She doesn't buy cigarettes, bums them off of friends, when she is around other smokers.  She is drinking 2-3 glasses of wine/night.  Drinks every evening in her house, and socially.  She is caring for her mother. She is going to her appointments with her, and sees "how I might end up" so feels more motivated to make changes, and start medications if needed.  Past Medical History  Diagnosis Date  . Hypertension   . GERD (gastroesophageal reflux disease)   . Allergy   . Dyslipidemia     hypercholesterolemia  . Lipoma of axilla     h/o left  . 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 Hx gestational diabetes   . Low sodium levels     history of low sodium with diuretics in the past.  . Goiter     u/s 07/2004, generalized enlargement without cysts or nodules; normal TSH   Past Surgical History  Procedure Laterality Date  . Tonsillectomy     History   Social History  . Marital Status: Divorced    Spouse Name: N/A    Number of Children: 1  . Years of Education: N/A   Occupational History  . architect    Social History Main Topics  . Smoking status: Current Some Day Smoker -- 0.10 packs/day for 41  years    Types: Cigarettes  . Smokeless tobacco: Not on file     Comment: 1 cigarette daily, at bedtime  . Alcohol Use: Yes     Comment: 2-3 glasses of red wine per day.  . Drug Use: No  . Sexually Active: Not Currently   Other Topics Concern  . Not on file   Social History Narrative   Lives with her son; no pets   Current outpatient prescriptions:aspirin 81 MG tablet, Take 81 mg by mouth daily. , Disp: , Rfl: ;  Calcium Carbonate (CALCIUM 600 PO), Take 1 tablet by mouth daily., Disp: , Rfl: ;  Cholecalciferol (VITAMIN D) 1000 UNITS capsule, Take 1,000 Units by mouth daily., Disp: , Rfl: ;  Multiple Vitamin (ANTI-OXIDANT PO), Take 1 tablet by mouth daily., Disp: , Rfl: ;  vitamin C (ASCORBIC ACID) 500 MG tablet, Take 500 mg by mouth daily., Disp: , Rfl:  vitamin E 100 UNIT capsule, Take 100 Units by mouth daily., Disp: , Rfl: ;  ranitidine (ZANTAC) 150 MG tablet, Take 150 mg by mouth as needed for heartburn., Disp: , Rfl:   Allergies  Allergen Reactions  . Codeine Other (See Comments)    Would make her start her period. Also just affected her body poorly so she just does not take  it.  . Hctz (Hydrochlorothiazide) Other (See Comments)    Hyponatremia   . Latex Rash  . Penicillins Rash   ROS: denies fevers, URI symptoms, cough, shortness of breath, exertional chest pain, swelling, GI complaints, GU complaints, depression, joint pains, skin rashes, bleeding/bruising or other concerns.  See HPI.  PHYSICAL EXAM: BP 154/100  Pulse 84  Ht 5\' 5"  (1.651 m)  Wt 174 lb (78.926 kg)  BMI 28.96 kg/m2 154/84 on repeat by MD, RA Well developed, pleasant, overweight female in no distress Neck: mild diffuse enlargement of thyroid, no nodule/mass or tenderness.  No lymphadenopathy Heart: regular rate and rhythm without murmur Lungs: clear bilaterally Back: no spine or CVA tenderness Abdomen: soft, nontender, no organomegaly or mass Extremities: no edema Skin: no rash/lesions Psych: normal  mood, affect, hygiene and grooming Neuro: alert and oriented.  Cranial nerves grossly intact. Normal strength, gait.  Labs: Glucose 107 Lab Results  Component Value Date   HGBA1C 5.4 11/21/2012   Lab Results  Component Value Date   CHOL 255* 11/21/2012   HDL 68 11/21/2012   LDLCALC 161* 11/21/2012   TRIG 280* 11/21/2012   CHOLHDL 3.8 11/21/2012   ASSESSMENT/PLAN:  Impaired fasting glucose - reviewed diet, exercise, weight loss.  needs to decrease alcohol intake  Essential hypertension, benign - start lisinopril.  risks and side effects reviewed.  cut in 1/2 if dizziness or BP too low.  call if develops cough - Plan: lisinopril (PRINIVIL,ZESTRIL) 10 MG tablet  Pure hypercholesterolemia - LDL improved.  TG remains elevated.  needs to cut back on alcohol intake.  consider taking fish oil.  no meds for now--recheck in 3-6 months.   Tobacco use disorder - counseled re: ways to stop smoking in social situations.  encouraged complete cessation.  congratulated on her hard work so far  Alcohol abuse, daily use - counseled extensively re: risks of alcohol use.  counseled re: stress reduction techniques.  no more than 1 glass/day (5 oz) recommended.    Reduce wine intake to just 1 glass per day.  If you feel you cannot control this, then only drink socially, and do not have wine in the house.  Develop other relaxation techniques (yoga, mediation, crocheting).  Try and exercise at least 30 minutes daily.  Cutting back on the wine will help you lose weight, reduce triglycerides, reduce blood sugar, and potentially reduce blood pressure.  F/u 3-4 weeks on HTN.  Bring list of BP's to visit (and monitor to be checked, if desired). Will do EKG at next appointment (declined EKG today due to time constraint)--get as baseline, due to HTN  40 minute visit, more than 1/2 spent counseling

## 2012-11-25 DIAGNOSIS — F1010 Alcohol abuse, uncomplicated: Secondary | ICD-10-CM | POA: Insufficient documentation

## 2012-12-28 ENCOUNTER — Ambulatory Visit: Payer: No Typology Code available for payment source | Admitting: Family Medicine

## 2012-12-29 ENCOUNTER — Ambulatory Visit: Payer: No Typology Code available for payment source | Admitting: Family Medicine

## 2013-01-02 ENCOUNTER — Ambulatory Visit: Payer: No Typology Code available for payment source | Admitting: Family Medicine

## 2013-01-21 ENCOUNTER — Other Ambulatory Visit: Payer: Self-pay | Admitting: Family Medicine

## 2013-02-19 ENCOUNTER — Other Ambulatory Visit: Payer: Self-pay | Admitting: Family Medicine

## 2013-02-23 ENCOUNTER — Other Ambulatory Visit: Payer: Self-pay

## 2013-03-25 ENCOUNTER — Other Ambulatory Visit: Payer: Self-pay | Admitting: Family Medicine

## 2013-04-05 ENCOUNTER — Encounter: Payer: Self-pay | Admitting: Family Medicine

## 2013-04-05 ENCOUNTER — Ambulatory Visit (INDEPENDENT_AMBULATORY_CARE_PROVIDER_SITE_OTHER): Payer: No Typology Code available for payment source | Admitting: Family Medicine

## 2013-04-05 VITALS — BP 134/78 | HR 88 | Ht 65.0 in | Wt 171.0 lb

## 2013-04-05 DIAGNOSIS — Z79899 Other long term (current) drug therapy: Secondary | ICD-10-CM

## 2013-04-05 DIAGNOSIS — Z23 Encounter for immunization: Secondary | ICD-10-CM

## 2013-04-05 DIAGNOSIS — I10 Essential (primary) hypertension: Secondary | ICD-10-CM

## 2013-04-05 DIAGNOSIS — F17200 Nicotine dependence, unspecified, uncomplicated: Secondary | ICD-10-CM

## 2013-04-05 DIAGNOSIS — E781 Pure hyperglyceridemia: Secondary | ICD-10-CM | POA: Insufficient documentation

## 2013-04-05 DIAGNOSIS — R7301 Impaired fasting glucose: Secondary | ICD-10-CM

## 2013-04-05 LAB — BASIC METABOLIC PANEL
BUN: 12 mg/dL (ref 6–23)
Chloride: 101 mEq/L (ref 96–112)
Potassium: 4.4 mEq/L (ref 3.5–5.3)

## 2013-04-05 MED ORDER — LISINOPRIL 10 MG PO TABS: ORAL_TABLET | ORAL | Status: DC

## 2013-04-05 NOTE — Progress Notes (Signed)
Chief Complaint  Patient presents with  . Hypertension    follow up-EKG at visit per last note per Dr.Analeigha Nauman.    Patient presents for f/u on HTN.  She was started on lisinopril back in August, and she was due to return in 3-4 weeks.  She is compliant with taking her medication daily, denies cough or other side effects.  Denies headaches, dizziness, chest pain.  Checking BP periodically, and it is running 128-134/mid-80's.  She is exercising 45 minutes (fitness blender program, on YouTube--cardio and strength training) 4x/week. She is sleeping better at night.  She has reduced her wine intake from 2-3 glasses down to 1-2 glasses of wine every night.  Hypertriglyceridemia--she ran out of fish oil 2 months ago.  She had been taking it BID.  She started sprinkling flax seed on her food, which had helped with her hot flashes. But then she started having diarrhea, and joint pains (in both knees, arms)--worrying it could be fibromyalgia.  Symptoms all improved after stopping the flax seed.  Past Medical History  Diagnosis Date  . Hypertension   . GERD (gastroesophageal reflux disease)   . Allergy   . Dyslipidemia     hypercholesterolemia  . Lipoma of axilla     h/o left  . 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 Hx gestational diabetes   . Low sodium levels     history of low sodium with diuretics in the past.  . Goiter     u/s 07/2004, generalized enlargement without cysts or nodules; normal TSH   Past Surgical History  Procedure Laterality Date  . Tonsillectomy     History   Social History  . Marital Status: Divorced    Spouse Name: N/A    Number of Children: 1  . Years of Education: N/A   Occupational History  . architect    Social History Main Topics  . Smoking status: Current Some Day Smoker -- 0.10 packs/day for 41 years    Types: Cigarettes  . Smokeless tobacco: Not on file     Comment: 1 cigarette daily, at bedtime  .  Alcohol Use: Yes     Comment: 2-3 glasses of red wine per day.  . Drug Use: No  . Sexual Activity: Not Currently   Other Topics Concern  . Not on file   Social History Narrative   Lives with her son; no pets   Current outpatient prescriptions:aspirin 81 MG tablet, Take 81 mg by mouth daily. , Disp: , Rfl: ;  Calcium Carbonate (CALCIUM 600 PO), Take 1 tablet by mouth daily., Disp: , Rfl: ;  Cholecalciferol (VITAMIN D) 1000 UNITS capsule, Take 1,000 Units by mouth daily., Disp: , Rfl: ;  lisinopril (PRINIVIL,ZESTRIL) 10 MG tablet, TAKE 1 TABLET BY MOUTH DAILY, Disp: 90 tablet, Rfl: 1 Multiple Vitamin (ANTI-OXIDANT PO), Take 1 tablet by mouth daily., Disp: , Rfl: ;  ranitidine (ZANTAC) 150 MG tablet, Take 150 mg by mouth as needed for heartburn., Disp: , Rfl: ;  vitamin C (ASCORBIC ACID) 500 MG tablet, Take 500 mg by mouth daily., Disp: , Rfl: ;  vitamin E 100 UNIT capsule, Take 100 Units by mouth daily., Disp: , Rfl:   Allergies  Allergen Reactions  . Codeine Other (See Comments)    Would make her start her period. Also just affected her body poorly so she just does not take it.  Marland Kitchen Hctz [Hydrochlorothiazide] Other (See  Comments)    Hyponatremia   . Latex Rash  . Penicillins Rash   ROS:  Denies fevers, chills, URI symptoms, headaches, dizziness, chest pain, edema, GI complaints. She has lost 3 pounds. She denies cough, shortness of breath, or other concerns.  Moods are good.  PHYSICAL EXAM: BP 130/86  Pulse 88  Ht 5\' 5"  (1.651 m)  Wt 171 lb (77.565 kg)  BMI 28.46 kg/m2 134/78 on repeat by MD, RA Pleasant female in no distress Neck: no lymphadenopathy, thyromegaly or carotid bruit Heart: regular rate and rhythm without murmur Lungs: clear bilaterally Extremities: no edema  EKG:  NSR rate 68.  Borderline LAE.  ?q in V2 vs poor lead placement. No acute changes noted.  This is a baseline test, for new diagnosis of HTN.  There is no LVH  ASSESSMENT/PLAN:  Essential hypertension,  benign - Plan: Basic metabolic panel, Lipid panel, lisinopril (PRINIVIL,ZESTRIL) 10 MG tablet  Need for prophylactic vaccination and inoculation against influenza - Plan: Flu Vaccine QUAD 36+ mos IM  HTN (hypertension) - Plan: EKG 12-Lead  Tobacco use disorder  Encounter for long-term (current) use of other medications  Impaired fasting glucose - Plan: Hemoglobin A1c, Glucose, random  Hypertriglyceridemia - Plan: Lipid panel  b-met today Return after holidays for lipids, glucose (early February) Restart fish oil--take 3000-4000mg  of omega-3 fish oil daily. Continue regular exercise--try for at least 5-6 days/week  Encouraged complete cessation of smoking

## 2013-04-05 NOTE — Patient Instructions (Signed)
Return after holidays for fasting bloodwork--lipids, glucose Restart fish oil--take 3000-4000mg  of omega-3 fish oil daily. (if that causes side effects, then go back to the 2/day you previously took) Continue regular exercise--try for at least 5-6 days/week.  Try Estroven (black cohosh and soy) for hot flashes  Please avoid ALL cigarettes, after the New Year.

## 2013-04-24 ENCOUNTER — Encounter: Payer: Self-pay | Admitting: Family Medicine

## 2013-04-26 ENCOUNTER — Other Ambulatory Visit: Payer: Self-pay | Admitting: *Deleted

## 2013-04-26 MED ORDER — ESOMEPRAZOLE MAGNESIUM 40 MG PO CPDR: 40.0000 mg | DELAYED_RELEASE_CAPSULE | Freq: Every day | ORAL | Status: DC

## 2013-05-02 ENCOUNTER — Encounter: Payer: Self-pay | Admitting: Internal Medicine

## 2013-05-02 LAB — HM MAMMOGRAPHY: HM Mammogram: NEGATIVE

## 2013-09-26 ENCOUNTER — Other Ambulatory Visit: Payer: Self-pay | Admitting: Family Medicine

## 2013-10-16 ENCOUNTER — Other Ambulatory Visit: Payer: Self-pay | Admitting: *Deleted

## 2013-10-16 MED ORDER — ESOMEPRAZOLE MAGNESIUM 40 MG PO CPDR: DELAYED_RELEASE_CAPSULE | ORAL | Status: DC

## 2013-11-02 ENCOUNTER — Other Ambulatory Visit: Payer: Self-pay

## 2013-11-06 ENCOUNTER — Ambulatory Visit (INDEPENDENT_AMBULATORY_CARE_PROVIDER_SITE_OTHER): Payer: 59 | Admitting: Family Medicine

## 2013-11-06 ENCOUNTER — Encounter: Payer: Self-pay | Admitting: Family Medicine

## 2013-11-06 VITALS — BP 130/72 | HR 72 | Ht 65.0 in | Wt 173.0 lb

## 2013-11-06 DIAGNOSIS — K219 Gastro-esophageal reflux disease without esophagitis: Secondary | ICD-10-CM

## 2013-11-06 DIAGNOSIS — R7301 Impaired fasting glucose: Secondary | ICD-10-CM

## 2013-11-06 DIAGNOSIS — E782 Mixed hyperlipidemia: Secondary | ICD-10-CM

## 2013-11-06 DIAGNOSIS — E781 Pure hyperglyceridemia: Secondary | ICD-10-CM

## 2013-11-06 DIAGNOSIS — I10 Essential (primary) hypertension: Secondary | ICD-10-CM

## 2013-11-06 LAB — GLUCOSE, RANDOM: Glucose, Bld: 112 mg/dL — ABNORMAL HIGH (ref 70–99)

## 2013-11-06 LAB — LIPID PANEL
CHOLESTEROL: 248 mg/dL — AB (ref 0–200)
HDL: 65 mg/dL (ref >39)
LDL CALC: 135 mg/dL — AB (ref 0–99)
TRIGLYCERIDES: 240 mg/dL — AB (ref <150)
Total CHOL/HDL Ratio: 3.8 Ratio
VLDL: 48 mg/dL — ABNORMAL HIGH (ref 0–40)

## 2013-11-06 LAB — POCT GLYCOSYLATED HEMOGLOBIN (HGB A1C): Hemoglobin A1C: 5.4

## 2013-11-06 MED ORDER — LISINOPRIL 10 MG PO TABS: ORAL_TABLET | ORAL | Status: DC

## 2013-11-06 MED ORDER — ESOMEPRAZOLE MAGNESIUM 40 MG PO CPDR: 40.0000 mg | DELAYED_RELEASE_CAPSULE | Freq: Every day | ORAL | Status: DC

## 2013-11-06 NOTE — Patient Instructions (Signed)
Continue your current medications.  Continue lowfat, low cholesterol diet, and low sodium diet. Continue to check BP periodically, and return if they are running >140/90 Continue regular exercise.  Try and limit alcohol to just 1 glass/night Congratulations on quitting smoking  We will contact you with cholesterol results.  If the triglycerides are still >150, then starting omega-3 fish oil 3000-4000mg  daily might help.

## 2013-11-06 NOTE — Progress Notes (Signed)
Chief Complaint  Patient presents with  . Hypertension    nonfasting med check, labs were this am. Doing A1C right now.    Patient presents for follow up on hypertension.  She was started on lisinopril almost a year ago. Blood pressures elsewhere are 128-135/70-85.  Denies dizziness, headaches, chest pain.  Denies side effects of medications, cough.  She is staying active--some walking and swimming, Chief Strategy Officer (YouTube video)--30-45 minutes 3-4x/week (includes weights).  She increased her exercise, reduced her wine, and reduced her food intake in order to prevent the weight gain from quitting smoking (happened when she quit in the past).  She quit 1 month ago, and no significant weight gain.  Hyperlipidemia:  In past LDL and TG have been elevated.  She has not been taking fish oil lately.  Didn't tolerate flax seed (GI issues). Has been eating healthier, cut back some on wine intake as above.  GERD:  Well controlled with Nexium.  No longer needing zantac prn (as she did before). She has recurrent symptoms ("projectile vomiting") if she misses a dose of Nexium.  Past Medical History  Diagnosis Date  . Hypertension   . GERD (gastroesophageal reflux disease)   . Allergy   . Dyslipidemia     hypercholesterolemia  . Lipoma of axilla     h/o left  . 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 Hx gestational diabetes   . Low sodium levels     history of low sodium with diuretics in the past.  . Goiter     u/s 07/2004, generalized enlargement without cysts or nodules; normal TSH   Past Surgical History  Procedure Laterality Date  . Tonsillectomy     History   Social History  . Marital Status: Divorced    Spouse Name: N/A    Number of Children: 1  . Years of Education: N/A   Occupational History  . architect    Social History Main Topics  . Smoking status: Former Smoker -- 0.10 packs/day for 41 years    Types: Cigarettes    Quit date:  10/07/2013  . Smokeless tobacco: Never Used  . Alcohol Use: Yes     Comment: 1.5-2 glasses of wine every evening (6/7 days usually)  . Drug Use: No  . Sexual Activity: Not Currently   Other Topics Concern  . Not on file   Social History Narrative   Lives alone--sone moved to [place], but he will be moving back in with her soon (while attending [employer]); no pets   Family History  Problem Relation Age of Onset  . Osteoporosis Mother   . Dementia Mother   . Transient ischemic attack Mother   . Hypertension Mother   . Heart disease Mother     atrial fibrillation  . Atrial fibrillation Mother   . Cancer Father 28    osteosarcoma, metastasized to lung  . Diabetes Maternal Uncle   . Cancer Paternal Uncle 62    colon  . Cancer Maternal Grandfather     throat  . Breast cancer Paternal Grandmother 58  . Crohn's disease Cousin   . Psoriasis Cousin   . Arthritis Cousin     rheumatoid  . Diabetes Cousin     type 1   Outpatient Encounter Prescriptions as of 11/06/2013  Medication Sig Note  . aspirin 81 MG tablet Take 81 mg by mouth daily.    . Calcium Carbonate (CALCIUM  600 PO) Take 1 tablet by mouth daily. 11/24/2012: Takes about 5 days a week  . Cholecalciferol (VITAMIN D) 1000 UNITS capsule Take 1,000 Units by mouth daily. 11/24/2012: Takes 5 days a week  . esomeprazole (NEXIUM) 40 MG capsule Take 1 capsule (40 mg total) by mouth daily before supper.   Marland Kitchen lisinopril (PRINIVIL,ZESTRIL) 10 MG tablet TAKE 1 TABLET BY MOUTH DAILY   . Multiple Vitamin (ANTI-OXIDANT PO) Take 1 tablet by mouth daily. 11/06/2013: Takes daily  . NON FORMULARY Take 1 capsule by mouth daily. 11/06/2013: TUMERIC 500mg   . vitamin C (ASCORBIC ACID) 500 MG tablet Take 500 mg by mouth daily.   . vitamin E 100 UNIT capsule Take 100 Units by mouth daily.   . [DISCONTINUED] esomeprazole (NEXIUM) 40 MG capsule TAKE ONE CAPSULE BY MOUTH EVERY DAY AT 12:00 PM   . [DISCONTINUED] lisinopril (PRINIVIL,ZESTRIL) 10 MG tablet TAKE 1  TABLET BY MOUTH DAILY   . [DISCONTINUED] ranitidine (ZANTAC) 150 MG tablet Take 150 mg by mouth as needed for heartburn. 06/01/2012: Takes 1 tablet as needed, usually every 3 days   Allergies  Allergen Reactions  . Codeine Other (See Comments)    Would make her start her period. Also just affected her body poorly so she just does not take it.  Marland Kitchen Hctz [Hydrochlorothiazide] Other (See Comments)    Hyponatremia   . Latex Rash  . Penicillins Rash   ROS:  No fevers, chills, headaches, dizziness, chest pain, shortness of breath, URI symptoms. No significant weight changes (up 2 pounds since December) No thyroid symptoms--energy normal, no hair/skin/bowel/mood changes.  She does not some cracking in both thumb nails, denies trauma. Sneezing frequently at work; wakes up congested in the mornings. No depression/anxiety, GU complaints or other concerns, except as per HPI.  PHYSICAL EXAM: BP 130/72  Pulse 72  Ht 5\' 5"  (1.651 m)  Wt 173 lb (78.472 kg)  BMI 28.79 kg/m2  Pleasant female in no distress  Neck: no lymphadenopathy, thyromegaly or carotid bruit  Heart: regular rate and rhythm without murmur  Lungs: clear bilaterally  Abdomen: soft, nontender, no organomegaly or mass Back: no CVA tenderness Extremities: no edema, 2+ pulses Neuro: alert and oriented.  Cranial nerves intact. normal strength, sensation gait. Psych: normal mood, affect, hygiene and grooming  Lab Results  Component Value Date   HGBA1C 5.4 11/06/2013    ASSESSMENT/PLAN:  Essential hypertension, benign - well controlled - Plan: Lipid panel, lisinopril (PRINIVIL,ZESTRIL) 10 MG tablet  Hypertriglyceridemia - diet reviewed.  Cut back further on alcohol intake.  If TG elevated, restart fish oil - Plan: Lipid panel  Impaired fasting glucose - labs pending, A1c normal.  Weight loss encouraged. Continue regular exercise and healthy diet. - Plan: Glucose, random, HgB A1c, CANCELED: Hemoglobin A1c  Mixed hyperlipidemia -  reviewed low cholesterol and lowfat diet  Gastroesophageal reflux disease without esophagitis - stable.  improved since dietary changes (no longer needing prn meds) - Plan: esomeprazole (NEXIUM) 40 MG capsule  Consider claritin daily for allergies  She sees GYN and HM is UTD (mammo, colon, imms). F/u will depend on lab results from today (6 mos versus 1 year)

## 2013-11-07 ENCOUNTER — Encounter: Payer: Self-pay | Admitting: Family Medicine

## 2013-11-07 NOTE — Telephone Encounter (Signed)
It says receipt confirmed by pharmacy, as done on 7/20.  Please call pharmacy to check (I only did 6 mos, and should be seeing her in 6 mos to do more refills then).

## 2013-11-08 ENCOUNTER — Other Ambulatory Visit: Payer: Self-pay | Admitting: *Deleted

## 2013-11-08 DIAGNOSIS — E782 Mixed hyperlipidemia: Secondary | ICD-10-CM

## 2013-11-08 DIAGNOSIS — Z0000 Encounter for general adult medical examination without abnormal findings: Secondary | ICD-10-CM

## 2013-11-08 DIAGNOSIS — R7301 Impaired fasting glucose: Secondary | ICD-10-CM

## 2013-11-08 DIAGNOSIS — I10 Essential (primary) hypertension: Secondary | ICD-10-CM

## 2013-11-08 DIAGNOSIS — Z79899 Other long term (current) drug therapy: Secondary | ICD-10-CM

## 2014-03-05 ENCOUNTER — Other Ambulatory Visit: Payer: Self-pay | Admitting: Family Medicine

## 2014-05-03 ENCOUNTER — Other Ambulatory Visit: Payer: Self-pay

## 2014-05-04 ENCOUNTER — Other Ambulatory Visit: Payer: 59

## 2014-05-04 DIAGNOSIS — I10 Essential (primary) hypertension: Secondary | ICD-10-CM

## 2014-05-04 DIAGNOSIS — Z0000 Encounter for general adult medical examination without abnormal findings: Secondary | ICD-10-CM

## 2014-05-04 DIAGNOSIS — R7301 Impaired fasting glucose: Secondary | ICD-10-CM

## 2014-05-04 DIAGNOSIS — E782 Mixed hyperlipidemia: Secondary | ICD-10-CM

## 2014-05-04 DIAGNOSIS — Z79899 Other long term (current) drug therapy: Secondary | ICD-10-CM

## 2014-05-04 LAB — HEMOGLOBIN A1C
HEMOGLOBIN A1C: 5.6 % (ref <5.7)
Mean Plasma Glucose: 114 mg/dL (ref <117)

## 2014-05-04 LAB — COMPREHENSIVE METABOLIC PANEL
ALBUMIN: 4.2 g/dL (ref 3.5–5.2)
ALT: 23 U/L (ref 0–35)
AST: 18 U/L (ref 0–37)
Alkaline Phosphatase: 62 U/L (ref 39–117)
BILIRUBIN TOTAL: 0.4 mg/dL (ref 0.2–1.2)
BUN: 14 mg/dL (ref 6–23)
CO2: 26 meq/L (ref 19–32)
CREATININE: 0.63 mg/dL (ref 0.50–1.10)
Calcium: 9.6 mg/dL (ref 8.4–10.5)
Chloride: 104 mEq/L (ref 96–112)
Glucose, Bld: 99 mg/dL (ref 70–99)
POTASSIUM: 4.7 meq/L (ref 3.5–5.3)
Sodium: 140 mEq/L (ref 135–145)
TOTAL PROTEIN: 6.7 g/dL (ref 6.0–8.3)

## 2014-05-04 LAB — LIPID PANEL
CHOLESTEROL: 231 mg/dL — AB (ref 0–200)
HDL: 64 mg/dL (ref >39)
LDL CALC: 140 mg/dL — AB (ref 0–99)
Total CHOL/HDL Ratio: 3.6 Ratio
Triglycerides: 134 mg/dL (ref <150)
VLDL: 27 mg/dL (ref 0–40)

## 2014-05-04 LAB — TSH: TSH: 1.232 u[IU]/mL (ref 0.350–4.500)

## 2014-05-07 ENCOUNTER — Telehealth: Payer: Self-pay | Admitting: Internal Medicine

## 2014-05-07 ENCOUNTER — Ambulatory Visit (INDEPENDENT_AMBULATORY_CARE_PROVIDER_SITE_OTHER): Payer: 59 | Admitting: Family Medicine

## 2014-05-07 ENCOUNTER — Encounter: Payer: Self-pay | Admitting: Family Medicine

## 2014-05-07 VITALS — BP 134/68 | HR 72 | Ht 65.0 in | Wt 176.0 lb

## 2014-05-07 DIAGNOSIS — R7301 Impaired fasting glucose: Secondary | ICD-10-CM

## 2014-05-07 DIAGNOSIS — I10 Essential (primary) hypertension: Secondary | ICD-10-CM

## 2014-05-07 DIAGNOSIS — E7800 Pure hypercholesterolemia, unspecified: Secondary | ICD-10-CM

## 2014-05-07 DIAGNOSIS — Z23 Encounter for immunization: Secondary | ICD-10-CM

## 2014-05-07 DIAGNOSIS — E785 Hyperlipidemia, unspecified: Secondary | ICD-10-CM

## 2014-05-07 DIAGNOSIS — K219 Gastro-esophageal reflux disease without esophagitis: Secondary | ICD-10-CM

## 2014-05-07 DIAGNOSIS — J309 Allergic rhinitis, unspecified: Secondary | ICD-10-CM

## 2014-05-07 MED ORDER — LISINOPRIL 10 MG PO TABS: 10.0000 mg | ORAL_TABLET | Freq: Every day | ORAL | Status: DC

## 2014-05-07 NOTE — Patient Instructions (Addendum)
Great job in remaining free from smoking! Your triglycerides are now normal. Your LDL cholesterol is a little high.  Try and cut back further on cholesterol in your diet (ie cheese). Try taking claritin or Allegra once daily to help with allergies. Cutting back on alcohol intake will help lose weight (along with cutting back on the cheese), and increasing your exercise frequency.  Try and get at least 150-180 minutes of aerobic exercise each week, trying for at least 30 minutes most days of the week, as well as weight-bearing exercise twice a week.  Fat and Cholesterol Control Diet Fat and cholesterol levels in your blood and organs are influenced by your diet. High levels of fat and cholesterol may lead to diseases of the heart, small and large blood vessels, gallbladder, liver, and pancreas. CONTROLLING FAT AND CHOLESTEROL WITH DIET Although exercise and lifestyle factors are important, your diet is key. That is because certain foods are known to raise cholesterol and others to lower it. The goal is to balance foods for their effect on cholesterol and more importantly, to replace saturated and trans fat with other types of fat, such as monounsaturated fat, polyunsaturated fat, and omega-3 fatty acids. On average, a person should consume no more than 15 to 17 g of saturated fat daily. Saturated and trans fats are considered "bad" fats, and they will raise LDL cholesterol. Saturated fats are primarily found in animal products such as meats, butter, and cream. However, that does not mean you need to give up all your favorite foods. Today, there are good tasting, low-fat, low-cholesterol substitutes for most of the things you like to eat. Choose low-fat or nonfat alternatives. Choose round or loin cuts of red meat. These types of cuts are lowest in fat and cholesterol. Chicken (without the skin), fish, veal, and ground [place] breast are great choices. Eliminate fatty meats, such as hot dogs and salami. Even  shellfish have little or no saturated fat. Have a 3 oz (85 g) portion when you eat lean meat, poultry, or fish. Trans fats are also called "partially hydrogenated oils." They are oils that have been scientifically manipulated so that they are solid at room temperature resulting in a longer shelf life and improved taste and texture of foods in which they are added. Trans fats are found in stick margarine, some tub margarines, cookies, crackers, and baked goods.  When baking and cooking, oils are a great substitute for butter. The monounsaturated oils are especially beneficial since it is believed they lower LDL and raise HDL. The oils you should avoid entirely are saturated tropical oils, such as coconut and palm.  Remember to eat a lot from food groups that are naturally free of saturated and trans fat, including fish, fruit, vegetables, beans, grains (barley, rice, couscous, bulgur wheat), and pasta (without cream sauces).  IDENTIFYING FOODS THAT LOWER FAT AND CHOLESTEROL  Soluble fiber may lower your cholesterol. This type of fiber is found in fruits such as apples, vegetables such as broccoli, potatoes, and carrots, legumes such as beans, peas, and lentils, and grains such as barley. Foods fortified with plant sterols (phytosterol) may also lower cholesterol. You should eat at least 2 g per day of these foods for a cholesterol lowering effect.  Read package labels to identify low-saturated fats, trans fat free, and low-fat foods at the supermarket. Select cheeses that have only 2 to 3 g saturated fat per ounce. Use a heart-healthy tub margarine that is free of trans fats or partially hydrogenated  oil. When buying baked goods (cookies, crackers), avoid partially hydrogenated oils. Breads and muffins should be made from whole grains (whole-wheat or whole oat flour, instead of "flour" or "enriched flour"). Buy non-creamy canned soups with reduced salt and no added fats.  FOOD PREPARATION TECHNIQUES  Never  deep-fry. If you must fry, either stir-fry, which uses very little fat, or use non-stick cooking sprays. When possible, broil, bake, or roast meats, and steam vegetables. Instead of putting butter or margarine on vegetables, use lemon and herbs, applesauce, and cinnamon (for squash and sweet potatoes). Use nonfat yogurt, salsa, and low-fat dressings for salads.  LOW-SATURATED FAT / LOW-FAT FOOD SUBSTITUTES Meats / Saturated Fat (g)  Avoid: Steak, marbled (3 oz/85 g) / 11 g  Choose: Steak, lean (3 oz/85 g) / 4 g  Avoid: Hamburger (3 oz/85 g) / 7 g  Choose: Hamburger, lean (3 oz/85 g) / 5 g  Avoid: Ham (3 oz/85 g) / 6 g  Choose: Ham, lean cut (3 oz/85 g) / 2.4 g  Avoid: Chicken, with skin, dark meat (3 oz/85 g) / 4 g  Choose: Chicken, skin removed, dark meat (3 oz/85 g) / 2 g  Avoid: Chicken, with skin, light meat (3 oz/85 g) / 2.5 g  Choose: Chicken, skin removed, light meat (3 oz/85 g) / 1 g Dairy / Saturated Fat (g)  Avoid: Whole milk (1 cup) / 5 g  Choose: Low-fat milk, 2% (1 cup) / 3 g  Choose: Low-fat milk, 1% (1 cup) / 1.5 g  Choose: Skim milk (1 cup) / 0.3 g  Avoid: Hard cheese (1 oz/28 g) / 6 g  Choose: Skim milk cheese (1 oz/28 g) / 2 to 3 g  Avoid: Cottage cheese, 4% fat (1 cup) / 6.5 g  Choose: Low-fat cottage cheese, 1% fat (1 cup) / 1.5 g  Avoid: Ice cream (1 cup) / 9 g  Choose: Sherbet (1 cup) / 2.5 g  Choose: Nonfat frozen yogurt (1 cup) / 0.3 g  Choose: Frozen fruit bar / trace  Avoid: Whipped cream (1 tbs) / 3.5 g  Choose: Nondairy whipped topping (1 tbs) / 1 g Condiments / Saturated Fat (g)  Avoid: Mayonnaise (1 tbs) / 2 g  Choose: Low-fat mayonnaise (1 tbs) / 1 g  Avoid: Butter (1 tbs) / 7 g  Choose: Extra light margarine (1 tbs) / 1 g  Avoid: Coconut oil (1 tbs) / 11.8 g  Choose: Olive oil (1 tbs) / 1.8 g  Choose: Corn oil (1 tbs) / 1.7 g  Choose: Safflower oil (1 tbs) / 1.2 g  Choose: Sunflower oil (1 tbs) / 1.4 g  Choose:  Soybean oil (1 tbs) / 2.4 g  Choose: Canola oil (1 tbs) / 1 g Document Released: 04/06/2005 Document Revised: 08/01/2012 Document Reviewed: 07/05/2013 Exit[employer] Patient Information 2015 [place], [place]. This information is not intended to replace advice given to you by your health care provider. Make sure you discuss any questions you have with your health care provider.

## 2014-05-07 NOTE — Telephone Encounter (Signed)
Pt wants to know does she need to come in the Friday before to get labs done before her cpe on Monday.

## 2014-05-07 NOTE — Progress Notes (Signed)
Chief Complaint  Patient presents with  . Hypertension    fasting med check (labs already done).   Patient presents for follow up on hypertension. She last checked her BP 2 weeks ago and it was 135/82. Usually runs 130's/80's.  Denies dizziness, headaches, chest pain. Denies side effects of medications, cough. She is staying active--some walking, Chief Strategy Officer (YouTube video)--30-45 minutes 2-3/week (includes weights). She has gained some weight, but has remained off of smoking.  Hyperlipidemia: In past LDL and TG have been elevated. She has not been taking fish oil lately. Has been eating healthier.  She tries to limit her cheese, is better about avoiding other cholesterol-rich foods. She continues to drink 1.5-2 drinks each night (wine).  GERD: Well controlled with Nexium. No longer needing zantac prn (as she did before). She has recurrent symptoms if she misses a dose or two of Nexium.  Sneezing x 6 months--not sure what is triggering it.  She knows she can take claritin, but hasn't tried it yet.  Zyrtec makes her sleepy.  PMH, PSH, FH, SH reviewed.  Outpatient Encounter Prescriptions as of 05/07/2014  Medication Sig Note  . aspirin 81 MG tablet Take 81 mg by mouth daily.    Marland Kitchen esomeprazole (NEXIUM) 40 MG capsule Take 1 capsule (40 mg total) by mouth daily before supper.   Marland Kitchen lisinopril (PRINIVIL,ZESTRIL) 10 MG tablet TAKE 1 TABLET BY MOUTH EVERY DAY   . Multiple Vitamins-Minerals (WOMENS 50+ MULTI VITAMIN/MIN) TABS Take 1 tablet by mouth daily.   . NON FORMULARY Take 1 capsule by mouth daily. 11/06/2013: TUMERIC 500mg   . [DISCONTINUED] Calcium Carbonate (CALCIUM 600 PO) Take 1 tablet by mouth daily. 11/24/2012: Takes about 5 days a week  . [DISCONTINUED] Cholecalciferol (VITAMIN D) 1000 UNITS capsule Take 1,000 Units by mouth daily. 11/24/2012: Takes 5 days a week  . [DISCONTINUED] Multiple Vitamin (ANTI-OXIDANT PO) Take 1 tablet by mouth daily. 11/06/2013: Takes daily  .  [DISCONTINUED] vitamin C (ASCORBIC ACID) 500 MG tablet Take 500 mg by mouth daily.   . [DISCONTINUED] vitamin E 100 UNIT capsule Take 100 Units by mouth daily.    Allergies  Allergen Reactions  . Codeine Other (See Comments)    Would make her start her period. Also just affected her body poorly so she just does not take it.  Marland Kitchen Hctz [Hydrochlorothiazide] Other (See Comments)    Hyponatremia   . Latex Rash  . Penicillins Rash   ROS:  No fevers, chills, URI symptoms, headaches, dizziness, chest pain, shortness of breath, nausea, vomiting, bowel changes, urinary complaints, joint pains, bleeding, bruises, rashes, depression or other concerns. See HPI.  PHYSICAL EXAM: BP 146/82 mmHg  Pulse 72  Ht 5\' 5"  (1.651 m)  Wt 176 lb (79.833 kg)  BMI 29.29 kg/m2 134/68 on repeat by MD, RA Well developed, pleasant female in no distress HEENT: PERRL, EOMI, TM's and EAC's normal.  conjunctiva clear. Nasal mucosa mild-mod edematous, clear mucus noted on the left. No purulence.  Sinuses nontender.  OP is clear Neck: no lymphadenopathy, thyromegaly or mass Heart: Soft murmur at RUSB.  Regular rate and rhythm Lungs: clear bilaterally Back: no CVA tenderness Abdomen: soft, nontender, no organomegaly or mass Extremities: no edema Psych: normal mood, affect, hygiene and grooming Neuro: alert and oriented.  Cranial nerves intact; normal gait, strength.     Chemistry      Component Value Date/Time   NA 140 05/04/2014 0846   K 4.7 05/04/2014 0846   CL 104 05/04/2014 0846  CO2 26 05/04/2014 0846   BUN 14 05/04/2014 0846   CREATININE 0.63 05/04/2014 0846      Component Value Date/Time   CALCIUM 9.6 05/04/2014 0846   ALKPHOS 62 05/04/2014 0846   AST 18 05/04/2014 0846   ALT 23 05/04/2014 0846   BILITOT 0.4 05/04/2014 0846     Glucose 99  Lab Results  Component Value Date   CHOL 231* 05/04/2014   HDL 64 05/04/2014   LDLCALC 140* 05/04/2014   TRIG 134 05/04/2014   CHOLHDL 3.6 05/04/2014    Lab Results  Component Value Date   HGBA1C 5.6 05/04/2014   Lab Results  Component Value Date   TSH 1.232 05/04/2014   ASSESSMENT/PLAN:  Essential hypertension, benign - controlled.  continue current meds  Need for prophylactic vaccination and inoculation against influenza - Plan: Flu Vaccine QUAD 36+ mos PF IM (Fluarix Quad PF)  Pure hypercholesterolemia - borderline.  LDL<130 recommended.  excellent HDL, and TG improved.  reviewed low cholesterol diet.  recheck 6 mos  Gastroesophageal reflux disease, esophagitis presence not specified - controlled. encouraged to cut back on alcohol, and continue weight loss  Impaired fasting glucose - A1c is slightly higher, still normal; fasting sugar normal.  encouraged regular exercise, weight loss, healthy diet  Allergic rhinitis, unspecified allergic rhinitis type - recommended use of claritin or allegra as needed  F/u 6 months for CPE

## 2014-05-07 NOTE — Telephone Encounter (Signed)
She will need glucose, A1c and fasting lipids.  She can either come fasting to her visit, or come in prior (and please enter future orders--IFG and hyperlipidemia)

## 2014-05-08 ENCOUNTER — Encounter: Payer: Self-pay | Admitting: Internal Medicine

## 2014-05-08 NOTE — Telephone Encounter (Signed)
Future orders enter and pt was sent a mychart message to see if she wanted to come still that Friday or Monday to appt

## 2014-07-31 ENCOUNTER — Encounter: Payer: Self-pay | Admitting: Family Medicine

## 2014-08-08 ENCOUNTER — Ambulatory Visit (INDEPENDENT_AMBULATORY_CARE_PROVIDER_SITE_OTHER): Payer: 59 | Admitting: Family Medicine

## 2014-08-08 ENCOUNTER — Encounter: Payer: Self-pay | Admitting: Family Medicine

## 2014-08-08 VITALS — BP 130/80 | HR 80 | Temp 98.5°F | Ht 65.0 in | Wt 178.8 lb

## 2014-08-08 DIAGNOSIS — J302 Other seasonal allergic rhinitis: Secondary | ICD-10-CM

## 2014-08-08 DIAGNOSIS — H6983 Other specified disorders of Eustachian tube, bilateral: Secondary | ICD-10-CM | POA: Diagnosis not present

## 2014-08-08 MED ORDER — FLUTICASONE PROPIONATE 50 MCG/ACT NA SUSP: 2.0000 | Freq: Every day | NASAL | Status: DC

## 2014-08-08 NOTE — Progress Notes (Signed)
Chief Complaint  Patient presents with  . Ear Fullness    started 3-4 weeks ago, went to [facility] 4/9-given steroid shot and zpak. Still feels like ears are full and nasal congestion has not cleared up. No cough, no fevers. Drainage in her throat started today.    Went to [facility] on 4/10 with ear plugging, stuffy ears. She wasn't having any congestion, sore throat or cough at that time. She was treated with steroid shot and a z-pak.  She completed the z-pak 6 days ago. She had some dizziness 1 week ago, just for 1 day. Otherwise she has had not any changes or improvement in her symptoms, until this morning, when she started with postnasal drainage. She uses saline spray every morning, and has been using Neti-pot; not getting any drainage.  This allergy season has "killed her"--constant sneezing.  Sneezing and runny nose has improved since taking loratidine daily (about 6 weeks ago).    PMH, PSH, SH reviewed.  Outpatient Encounter Prescriptions as of 08/08/2014  Medication Sig Note  . aspirin 81 MG tablet Take 81 mg by mouth daily.    Marland Kitchen esomeprazole (NEXIUM) 40 MG capsule Take 1 capsule (40 mg total) by mouth daily before supper.   Marland Kitchen lisinopril (PRINIVIL,ZESTRIL) 10 MG tablet Take 1 tablet (10 mg total) by mouth daily.   Marland Kitchen loratadine (CLARITIN) 10 MG tablet Take 10 mg by mouth daily.   . Multiple Vitamins-Minerals (WOMENS 50+ MULTI VITAMIN/MIN) TABS Take 1 tablet by mouth daily.   . NON FORMULARY Take 1 capsule by mouth daily. 11/06/2013: TUMERIC 500mg   . vitamin B-12 (CYANOCOBALAMIN) 1000 MCG tablet Take 1,000 mcg by mouth daily.    Allergies  Allergen Reactions  . Codeine Other (See Comments)    Would make her start her period. Also just affected her body poorly so she just does not take it.  Marland Kitchen Hctz [Hydrochlorothiazide] Other (See Comments)    Hyponatremia   . Latex Rash  . Penicillins Rash    ROS: Denies fevers, chills, nausea, vomiting, diarrhea, rashes, bleeding/bruising, chest pain,  shortness of breath. No headaches.  Dizziness resolved.  See HPI  PHYSICAL EXAM: BP 130/80 mmHg  Pulse 80  Temp(Src) 98.5 F (36.9 C) (Tympanic)  Ht 5\' 5"  (1.651 m)  Wt 178 lb 12.8 oz (81.103 kg)  BMI 29.75 kg/m2 Well developed, well-appearing female in no distress HEENT: PERRL, EOMI, conjunctiva clear.  Nasal mucosa is moderately edematous with clear mucus, bilaterally. TM's are slightly yellow in color, but has a normal light reflex--suggestive of very slight effusion. No evidence of infection OP is normal Neck: no lymphadenopathy, thyromegaly or mass Heart: regular rate and rhythm without murmur Lungs: clear bilaterally Back: no CVA tenderness Extremities: no edema Skin: no rash Neuro: alert and oriented.  Cranial nerves intact. Normal gait  ASSESSMENT/PLAN:  Seasonal allergies - Plan: fluticasone (FLONASE) 50 MCG/ACT nasal spray  Eustachian tube dysfunction, bilateral   Continue with loratidine daily. Start Flonase as directed. Risks/side effects and proper use was reviewed. Consider using short-acting decongestant (pseudoephedrine or phenylephrine) if needed for ear discomfort--monitor your blood pressure while taking. Call or return if you develop acute onset of ear pain, or fever.

## 2014-08-08 NOTE — Patient Instructions (Signed)
  Continue with loratidine daily. Start Flonase as directed. Consider using short-acting decongestant (pseudoephedrine or phenylephrine) if needed for ear discomfort--monitor your blood pressure while taking. Call or return if you develop acute onset of ear pain, or fever.

## 2014-11-09 ENCOUNTER — Other Ambulatory Visit: Payer: 59

## 2014-11-12 ENCOUNTER — Encounter: Payer: 59 | Admitting: Family Medicine

## 2014-12-12 ENCOUNTER — Telehealth: Payer: Self-pay

## 2014-12-12 NOTE — Telephone Encounter (Signed)
I'm assuming based on the patient's age that her mother has Medicare.  We do not accept new medicare

## 2014-12-12 NOTE — Telephone Encounter (Signed)
Pt wants to know if you would take on her mother as a new pt.

## 2014-12-13 NOTE — Telephone Encounter (Signed)
Called back and let her know.

## 2015-01-02 ENCOUNTER — Other Ambulatory Visit: Payer: Self-pay | Admitting: Family Medicine

## 2015-02-11 ENCOUNTER — Other Ambulatory Visit: Payer: 59

## 2015-02-13 ENCOUNTER — Encounter: Payer: 59 | Admitting: Family Medicine

## 2015-02-14 ENCOUNTER — Ambulatory Visit (INDEPENDENT_AMBULATORY_CARE_PROVIDER_SITE_OTHER): Payer: 59 | Admitting: Family Medicine

## 2015-02-14 ENCOUNTER — Encounter: Payer: Self-pay | Admitting: Family Medicine

## 2015-02-14 VITALS — BP 152/88 | HR 84 | Ht 65.0 in | Wt 180.0 lb

## 2015-02-14 DIAGNOSIS — Z23 Encounter for immunization: Secondary | ICD-10-CM

## 2015-02-14 DIAGNOSIS — M791 Myalgia: Secondary | ICD-10-CM | POA: Diagnosis not present

## 2015-02-14 DIAGNOSIS — M62838 Other muscle spasm: Secondary | ICD-10-CM | POA: Diagnosis not present

## 2015-02-14 DIAGNOSIS — I10 Essential (primary) hypertension: Secondary | ICD-10-CM

## 2015-02-14 DIAGNOSIS — M7918 Myalgia, other site: Secondary | ICD-10-CM

## 2015-02-14 MED ORDER — CYCLOBENZAPRINE HCL 10 MG PO TABS: 5.0000 mg | ORAL_TABLET | Freq: Every evening | ORAL | Status: DC | PRN

## 2015-02-14 MED ORDER — NAPROXEN 500 MG PO TABS: 500.0000 mg | ORAL_TABLET | Freq: Two times a day (BID) | ORAL | Status: DC

## 2015-02-14 NOTE — Patient Instructions (Addendum)
  Try the stretches as shown at least twice daily--starting before even getting out of bed in the morning. Take naproxen twice daily with food (breakfast and dinner)--take this regularly for at least 7-10 days; you may take all 15 days, if needed. If this isn't helping, you can then add the muscle relaxant at bedtime.  Start with just 1/2 tablet, and make sure you have 8 hours before driving.  Go up to full tablet if not too drowsy and if ineffective. Try heat (heated seats, heating pad), massage, (consider trying the tennis ball). If you aren't improving, call for referral to [facility] for physical therapy.  Take your prescribed anti-inflammatory medication with food; discontinue or cut back the dose if you develop stomach pain/discomfort/side effects.  Do not take other over-the-counter pain medications such as ibuprofen, advil, motrin, aleve, naproxen at the same time.  Do not use longer than recommended.  It is okay to use acetaminophen (tylenol) along with this medication.  Please try and take your blood pressure medications every day, and periodically monitor your blood pressure to ensure that you are at goal (<130/80 ideally).

## 2015-02-14 NOTE — Progress Notes (Signed)
Chief Complaint  Patient presents with  . Hip Pain    left sided hip pain x 1 year. Worst in the mornings, once she gets going and ready for work better. Pain is lasting longer lately and is radiating to the right hip (morning time as well).    Pain is located in her left lower back/hip/buttock area.  Pain has been there for about a year, but getting worse.  More of a problem over the last 6 weeks. Sometimes it feels like it is "caught", sharp pain, and shoots down her leg.  This occurs about every 3 weeks. She is very stiff and in pain in the mornings--very hard to bend forward to put on her tights. Gets better as she is moving throughout the day.  Not currently having pain, but can make it hurt with certain movements.  She hasn't tried any NSAIDs or heat.  PMH, PSH, SH reviewed.  Outpatient Encounter Prescriptions as of 02/14/2015  Medication Sig Note  . aspirin 81 MG tablet Take 81 mg by mouth daily.    Marland Kitchen esomeprazole (NEXIUM) 40 MG capsule TAKE ONE CAPSULE BY MOUTH EVERY DAY BEFORE SUPPER   . lisinopril (PRINIVIL,ZESTRIL) 10 MG tablet Take 1 tablet (10 mg total) by mouth daily. 02/14/2015: Takes it every other day, only due to often forgetting  . loratadine (CLARITIN) 10 MG tablet Take 10 mg by mouth daily.   . NON FORMULARY Take 1 capsule by mouth daily. 02/14/2015: Tumeric 1000mg   . vitamin B-12 (CYANOCOBALAMIN) 1000 MCG tablet Take 1,000 mcg by mouth daily.   . cyclobenzaprine (FLEXERIL) 10 MG tablet Take 0.5-1 tablets (5-10 mg total) by mouth at bedtime as needed for muscle spasms.   . fluticasone (FLONASE) 50 MCG/ACT nasal spray Place 2 sprays into both nostrils daily. (Patient not taking: Reported on 02/14/2015)   . Multiple Vitamins-Minerals (WOMENS 50+ MULTI VITAMIN/MIN) TABS Take 1 tablet by mouth daily.   . naproxen (NAPROSYN) 500 MG tablet Take 1 tablet (500 mg total) by mouth 2 (two) times daily with a meal.    No facility-administered encounter medications on file as of  02/14/2015.   (naproxen and flexeril rx'd today, not prior to visit). Allergies  Allergen Reactions  . Codeine Other (See Comments)    Would make her start her period. Also just affected her body poorly so she just does not take it.  Marland Kitchen Hctz [Hydrochlorothiazide] Other (See Comments)    Hyponatremia   . Latex Rash  . Penicillins Rash   ROS: no headaches, dizziness, URI symptoms, cough, shortness of breath, bleeding, bruising, rash. No nausea, vomiting, bowel changes.  Recurrent heart burn if she misses 2 days of meds. Denies depression, anxiety  PHYSICAL EXAM: BP 152/88 mmHg  Pulse 84  Ht 5\' 5"  (1.651 m)  Wt 180 lb (81.647 kg)  BMI 29.95 kg/m2  Well developed, pleasant female in no distress. FROM at hip and pyriformis (felt slightly tighter on the left than the right). Spine and SI joint are nontender. Heart: regular rate and rhythm Lungs: clear bilaterally. Area of discomfort is in her buttock, just below the sciatic notch. Extremities: no edema, normal pulses Neuro: normal strength, sensation, DTR's. Negative SLR. Normal gait. Psych: normal mood, affect, hygiene and grooming  ASSESSMENT/PLAN:  Left buttock pain - Plan: naproxen (NAPROSYN) 500 MG tablet, cyclobenzaprine (FLEXERIL) 10 MG tablet  Need for prophylactic vaccination and inoculation against influenza - Plan: Flu Vaccine QUAD 36+ mos PF IM (Fluarix & Fluzone Quad PF)  Muscle spasm -  Plan: naproxen (NAPROSYN) 500 MG tablet, cyclobenzaprine (FLEXERIL) 10 MG tablet  Essential hypertension, benign - borderline today; likely contributed by some noncomplicance.   compliance with meds reviewed, how/when to take, risks/side effects of meds reviewed in detail. Shown stretches. Encouraged heat.    Try the stretches as shown at least twice daily--starting before even getting out of bed in the morning. Take naproxen twice daily with food (breakfast and dinner)--take this regularly for at least 7-10 days; you may take  all 15 days, if needed. If this isn't helping, you can then add the muscle relaxant at bedtime.  Start with just 1/2 tablet, and make sure you have 8 hours before driving.  Go up to full tablet if not too drowsy and if ineffective. Try heat (heated seats, heating pad), massage, (consider trying the tennis ball). If you aren't improving, call for referral to [facility] for physical therapy.   Please try and take your blood pressure medications every day, and periodically monitor your blood pressure to ensure that you are at goal (<130/80 ideally).

## 2015-03-13 ENCOUNTER — Other Ambulatory Visit: Payer: Self-pay | Admitting: Family Medicine

## 2015-05-30 ENCOUNTER — Encounter: Payer: Self-pay | Admitting: Family Medicine

## 2015-05-30 ENCOUNTER — Ambulatory Visit (INDEPENDENT_AMBULATORY_CARE_PROVIDER_SITE_OTHER): Payer: 59 | Admitting: Family Medicine

## 2015-05-30 VITALS — BP 144/82 | HR 80 | Ht 64.5 in | Wt 177.4 lb

## 2015-05-30 DIAGNOSIS — E782 Mixed hyperlipidemia: Secondary | ICD-10-CM

## 2015-05-30 DIAGNOSIS — Z5181 Encounter for therapeutic drug level monitoring: Secondary | ICD-10-CM

## 2015-05-30 DIAGNOSIS — Z1159 Encounter for screening for other viral diseases: Secondary | ICD-10-CM

## 2015-05-30 DIAGNOSIS — I10 Essential (primary) hypertension: Secondary | ICD-10-CM

## 2015-05-30 DIAGNOSIS — R7301 Impaired fasting glucose: Secondary | ICD-10-CM

## 2015-05-30 DIAGNOSIS — E049 Nontoxic goiter, unspecified: Secondary | ICD-10-CM

## 2015-05-30 DIAGNOSIS — Z0000 Encounter for general adult medical examination without abnormal findings: Secondary | ICD-10-CM

## 2015-05-30 DIAGNOSIS — R5383 Other fatigue: Secondary | ICD-10-CM

## 2015-05-30 DIAGNOSIS — K219 Gastro-esophageal reflux disease without esophagitis: Secondary | ICD-10-CM | POA: Diagnosis not present

## 2015-05-30 LAB — POCT URINALYSIS DIPSTICK
Bilirubin, UA: NEGATIVE
Glucose, UA: NEGATIVE
KETONES UA: NEGATIVE
LEUKOCYTES UA: NEGATIVE
NITRITE UA: NEGATIVE
PH UA: 6
PROTEIN UA: NEGATIVE
RBC UA: NEGATIVE
SPEC GRAV UA: 1.025
Urobilinogen, UA: NEGATIVE

## 2015-05-30 MED ORDER — ESOMEPRAZOLE MAGNESIUM 40 MG PO CPDR: DELAYED_RELEASE_CAPSULE | ORAL | Status: DC

## 2015-05-30 MED ORDER — LISINOPRIL 10 MG PO TABS: 10.0000 mg | ORAL_TABLET | Freq: Every day | ORAL | Status: DC

## 2015-05-30 NOTE — Patient Instructions (Signed)
  HEALTH MAINTENANCE RECOMMENDATIONS:  It is recommended that you get at least 30 minutes of aerobic exercise at least 5 days/week (for weight loss, you may need as much as 60-90 minutes). This can be any activity that gets your heart rate up. This can be divided in 10-15 minute intervals if needed, but try and build up your endurance at least once a week.  Weight bearing exercise is also recommended twice weekly.  Eat a healthy diet with lots of vegetables, fruits and fiber.  "Colorful" foods have a lot of vitamins (ie green vegetables, tomatoes, red peppers, etc).  Limit sweet tea, regular sodas and alcoholic beverages, all of which has a lot of calories and sugar.  Up to 1 alcoholic drink daily may be beneficial for women (unless trying to lose weight, watch sugars).  Drink a lot of water.  Calcium recommendations are 1200-1500 mg daily (1500 mg for postmenopausal women or women without ovaries), and vitamin D 1000 IU daily.  This should be obtained from diet and/or supplements (vitamins), and calcium should not be taken all at once, but in divided doses.  Monthly self breast exams and yearly mammograms for women over the age of 36 is recommended.  Sunscreen of at least SPF 30 should be used on all sun-exposed parts of the skin when outside between the hours of 10 am and 4 pm (not just when at beach or pool, but even with exercise, golf, tennis, and yard work!)  Use a sunscreen that says "broad spectrum" so it covers both UVA and UVB rays, and make sure to reapply every 1-2 hours.  Remember to change the batteries in your smoke detectors when changing your clock times in the spring and fall.  Use your seat belt every time you are in a car, and please drive safely and not be distracted with cell phones and texting while driving.   Please check your blood pressure periodically (when checking your mom's)--write it down, and either fax it, call us or send [place] a MyChart message with your blood  pressures.  Goal is <130-135/80-85.  If consistently >135-140/85-90 then we need to increase the lisinopril dose.  It is very important that you take the medication EVERY DAY. Continue regular exercise, low sodium diet.   Weight loss is encouraged.

## 2015-05-30 NOTE — Progress Notes (Signed)
Chief Complaint  Patient presents with  . Annual Exam    nonfasting annual exam, no pap-sees Dr.Adkins. Did not do eye exam, states she sees eye doctor and wears contacts so goes regularly. Still having left sided hip pain, never resolved since last seen.    Laura Payne is a 60 y.o. female who presents for a complete physical.  She has the following concerns:  Seen in end of October with L hip/buttock pain. Treated with naproxen and flexeril, helped some, but the stretching has helped the most. She does regular stretches in the morning which helps, but it is very painful when she first gets up. No longer takes any medications for it.  Hypertension. She doesn't check her BP elsewhere.  She has a monitor, and checks her mother's when she visits (and fills her pill boxes). She is very good at caring for her mother, less good about doing things for herself.  Only remembers to take her medications every other day (dictated by the nexium--if she skips more than a day, has recurrent reflux). Takes all meds and supplements just every other day.  Denies dizziness, headaches, chest pain. Denies side effects of medications, cough. She is staying active--some walking, Chief Strategy Officer (YouTube video)--30-45 minutes 3-4/week (includes weights).  Hyperlipidemia: In past LDL and TG have been elevated. She takes fish oil sporadically. Has been eating healthier. She tries to limit her cheese, is better about avoiding other cholesterol-rich foods. She continues to drink 2 drinks each night (wine).  GERD: Well controlled with Nexium, needing it just every other day. No longer needing zantac prn. Reports "choking" more often, just since a cold recently, which she related to postnasal drainage.  Prior to this illness, no problems with swallowing.  Impaired fasting glucose: Tries to limit carbs/sweets/sugar.  Allergies:  Seasonal, in the spring.  Just had a recent viral illness, with some residual PND.  L  sensorineural hearing loss, acute onset, for which she saw ENT (Dr. Lucia Gaskins).  She was treated with steroids x 2 weeks, and she reports her hearing resolved on the 14th day.   Immunization History  Administered Date(s) Administered  . Influenza Split 05/04/2011  . Influenza,inj,Quad PF,36+ Mos 04/05/2013, 05/07/2014, 02/14/2015  . Tdap 10/19/2007   Last Pap smear: with Dr. Toya Smothers reports likely 2015; due to schedule Last mammogram: yearly, in January.  Last had 04/2014 (we don't have records from [facility]).  Due now Last colonoscopy: age 45 Last DEXA: Never Dentist: twice yearly Ophtho: yearly Exercise:  Chief Strategy Officer (YouTube video)--30-45 minutes 3-4/week (includes weights) and some walking.  Past Medical History  Diagnosis Date  . Hypertension   . GERD (gastroesophageal reflux disease)   . Allergy   . Dyslipidemia     hypercholesterolemia  . Lipoma of axilla     h/o left  . 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 Hx gestational diabetes   . Low sodium levels     history of low sodium with diuretics in the past.  . Goiter     u/s 07/2004, generalized enlargement without cysts or nodules; normal TSH    Past Surgical History  Procedure Laterality Date  . Tonsillectomy      Social History   Social History  . Marital Status: Divorced    Spouse Name: N/A  . Number of Children: 1  . Years of Education: N/A   Occupational History  . Arboriculturist  Social History Main Topics  . Smoking status: Former Smoker -- 0.10 packs/day for 41 years    Types: Cigarettes    Quit date: 10/07/2013  . Smokeless tobacco: Never Used  . Alcohol Use: 0.0 oz/week    0 Standard drinks or equivalent per week     Comment: 2 glasses of wine every evening (6/7 days usually)  . Drug Use: No  . Sexual Activity: Not Currently   Other Topics Concern  . Not on file   Social History Narrative   Lives with her son, who is commuting to [facility]  daily. no pets.   Architect. 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   Monogamous, committed relationship    Family History  Problem Relation Age of Onset  . Osteoporosis Mother   . Dementia Mother   . Transient ischemic attack Mother   . Hypertension Mother   . Heart disease Mother     atrial fibrillation  . Atrial fibrillation Mother   . Cancer Father 82    osteosarcoma, metastasized to lung  . Diabetes Maternal Uncle   . Cancer Paternal Uncle 31    colon  . Cancer Maternal Grandfather     throat  . Breast cancer Paternal Grandmother 75  . Crohn's disease Cousin   . Psoriasis Cousin   . Arthritis Cousin     rheumatoid  . Diabetes Cousin     type 1    Outpatient Encounter Prescriptions as of 05/30/2015  Medication Sig Note  . aspirin 81 MG tablet Take 81 mg by mouth daily.    Marland Kitchen esomeprazole (NEXIUM) 40 MG capsule TAKE ONE CAPSULE BY MOUTH EVERY DAY BEFORE SUPPER 05/30/2015: Takes every other day  . lisinopril (PRINIVIL,ZESTRIL) 10 MG tablet Take 1 tablet (10 mg total) by mouth daily. 02/14/2015: Takes it every other day, only due to often forgetting  . Multiple Vitamins-Minerals (WOMENS 50+ MULTI VITAMIN/MIN) TABS Take 1 tablet by mouth daily. 05/30/2015: Only remembers every other day  . NON FORMULARY Take 1 capsule by mouth daily. 02/14/2015: Tumeric 1045m  . loratadine (CLARITIN) 10 MG tablet Take 10 mg by mouth daily. Reported on 05/30/2015 05/30/2015: Uses seasonally, spring and summer  . vitamin B-12 (CYANOCOBALAMIN) 1000 MCG tablet Take 1,000 mcg by mouth daily. Reported on 05/30/2015 05/30/2015: Ran out  . [DISCONTINUED] cyclobenzaprine (FLEXERIL) 10 MG tablet Take 0.5-1 tablets (5-10 mg total) by mouth at bedtime as needed for muscle spasms.   . [DISCONTINUED] esomeprazole (NEXIUM) 40 MG capsule TAKE ONE CAPSULE BY MOUTH EVERY DAY BEFORE SUPPER   . [DISCONTINUED] fluticasone (FLONASE) 50 MCG/ACT nasal spray Place 2 sprays into both nostrils daily. (Patient not taking:  Reported on 02/14/2015)   . [DISCONTINUED] naproxen (NAPROSYN) 500 MG tablet Take 1 tablet (500 mg total) by mouth 2 (two) times daily with a meal.    No facility-administered encounter medications on file as of 05/30/2015.    Allergies  Allergen Reactions  . Codeine Other (See Comments)    Would make her start her period. Also just affected her body poorly so she just does not take it.  .Marland KitchenHctz [Hydrochlorothiazide] Other (See Comments)    Hyponatremia   . Latex Rash  . Penicillins Rash    ROS: The patient denies anorexia, weight changes,vision changes, ear pain, breast concerns, chest pain, dizziness, syncope, dyspnea on exertion, swelling, nausea, vomiting, diarrhea, constipation, abdominal pain, melena, hematochezia, hematuria, incontinence, dysuria, vaginal bleeding, discharge, odor or itch, genital lesions, joint pains, numbness, tingling, weakness,  tremor, suspicious skin lesions, depression, anxiety, abnormal bleeding/bruising, or enlarged lymph nodes.  Left sided hearing loss after an illness, resolved completely. Recent URI with mild residual postnasal drainage. H/o arms aching (with brushing/washing hair)--resolved since taking Tumeric Postmenopausal, no bleeding, +hot flashes (improved, tolerable), tolerable night sweats. Left hip/buttock pain in the mornings, relieved by stretches.     PHYSICAL EXAM:  BP 142/74 mmHg  Pulse 80  Ht 5' 4.5" (1.638 m)  Wt 177 lb 6.4 oz (80.468 kg)  BMI 29.99 kg/m2 144/82 on repeat by MD  General Appearance:   Alert, cooperative, no distress, appears stated age  Head:   Normocephalic, without obvious abnormality, atraumatic  Eyes:   PERRL, conjunctiva/corneas clear, EOM's intact, fundi   benign  Ears:   Normal TM's and external ear canals  Nose:  Nares normal, mucosa mildly edematous, no erythema, no purulent drainage; no sinus tenderness  Throat:  Lips, mucosa, and tongue normal; teeth and gums normal  Neck:   Supple, no lymphadenopathy; thyroid: nosignificant enlargement/tenderness/nodules; no carotid  bruit or JVD  Back:  Spine nontender, no curvature, ROM normal, no CVA tenderness  Lungs:   Clear to auscultation bilaterally without wheezes, rales or ronchi; respirations unlabored  Chest Wall:   No tenderness or deformity  Heart:   Regular rate and rhythm, S1 and S2 normal, no murmur, rub  or gallop  Breast Exam:   Deferred to GYN  Abdomen:   Soft, non-tender, nondistended, normoactive bowel sounds,   no masses, no hepatosplenomegaly  Genitalia:   Deferred to GYN     Extremities:  No clubbing, cyanosis or edema  Pulses:  2+ and symmetric all extremities  Skin:  Skin color, texture, turgor normal, no rashes or lesions  Lymph nodes:  Cervical, supraclavicular, and axillary nodes normal  Neurologic:  CNII-XII intact, normal strength, sensation and gait; reflexes 2+ and symmetric throughout   Psych: Normal mood, affect, hygiene and grooming       Normal urine dip  ASSESSMENT/PLAN:  Annual physical exam - Plan: POCT Urinalysis Dipstick, CBC with Differential/Platelet, Lipid panel, VITAMIN D 25 Hydroxy (Vit-D Deficiency, Fractures), TSH, Comprehensive metabolic panel, Hepatitis C antibody  Mixed hyperlipidemia - lowfat, low cholesterol diet reviewed. recheck - Plan: Lipid panel  Impaired fasting glucose - Plan: Hemoglobin A1c  Gastroesophageal reflux disease, esophagitis presence not specified - continue Nexium qod, proper diet. Weight loss and decrease ETOH recommended - Plan: esomeprazole (NEXIUM) 40 MG capsule  Essential hypertension, benign - noncompliant with meds, BP above goal. Take meds DAILY, and monitor elsewhere - Plan: Comprehensive metabolic panel, lisinopril (PRINIVIL,ZESTRIL) 10 MG tablet  Goiter - Plan: TSH  Need for hepatitis C screening test - Plan: Hepatitis C antibody  Other fatigue  - Plan: CBC with Differential/Platelet, VITAMIN D 25 Hydroxy (Vit-D Deficiency, Fractures), TSH, Comprehensive metabolic panel  Medication monitoring encounter - Plan: CBC with Differential/Platelet, Comprehensive metabolic panel   c-met, lipid, TSH, CBC, A1c, Hep C, Vit D  F/u 6 months on BP sooner if persistently elevated.  Discussed monthly self breast exams and yearly mammograms (pt advised to give my name as well as her GYN's to [facility] when she has her mammograms); at least 30 minutes of aerobic activity at least 5 days/week, weight-bearing exercise at least 2x/wk; proper sunscreen use reviewed; healthy diet, including goals of calcium and vitamin D intake and alcohol recommendations (less than or equal to 1 drink/day) reviewed; regular seatbelt use; changing batteries in smoke detectors. Immunization recommendations discussed, UTD. Colonoscopy recommendations reviewed, UTD

## 2015-06-03 ENCOUNTER — Other Ambulatory Visit: Payer: 59

## 2015-06-22 ENCOUNTER — Other Ambulatory Visit: Payer: Self-pay | Admitting: Family Medicine

## 2015-07-16 ENCOUNTER — Other Ambulatory Visit: Payer: Self-pay | Admitting: Family Medicine

## 2015-07-23 ENCOUNTER — Telehealth: Payer: Self-pay | Admitting: Family Medicine

## 2015-07-24 NOTE — Telephone Encounter (Signed)
Left message for pt to see what meds have tried.  Pulled old chart and pt has tried Zantac, Aciphex, Protonix & Pantoprazole. Completed P.A.

## 2015-07-26 LAB — HM MAMMOGRAPHY

## 2015-07-28 NOTE — Telephone Encounter (Signed)
P.A. Isabelle Course, Esomeprazole is plan exclusion.  Preferred medications are Omeprazole, Pantoprazole, Rabeprazole and Dexilant. Do you want to switch?

## 2015-07-29 NOTE — Telephone Encounter (Signed)
Advise pt that Nexium was denied.  Let her know the alternatives and see if she wants to switch (not sure if copays would all be the same, Dexilant more than generics?). Looks like she tried pantoprazole and rabeprazole (aciphex) in the past, leaving options to include omeprazole, Dexilant, re-trying one of the ones she previously took, or using OTC Nexium, and taking 2 together to be equivalent to rx dose

## 2015-07-29 NOTE — Telephone Encounter (Signed)
Left message for patient to return my call.

## 2015-08-08 ENCOUNTER — Encounter: Payer: Self-pay | Admitting: *Deleted

## 2015-09-25 NOTE — Telephone Encounter (Signed)
Left message for pt

## 2015-12-01 NOTE — Progress Notes (Deleted)
   It appears she never returned for fasting labs after her physical in February.  Hypertension. She doesn't check her BP elsewhere.  She has a monitor, and checks her mother's when she visits (and fills her pill boxes). She is very good at caring for her mother, less good about doing things for herself.  Only remembers to take her medications every other day (dictated by the nexium--if she skips more than a day, has recurrent reflux). Takes all meds and supplements just every other day.  Denies dizziness, headaches, chest pain. Denies side effects of medications, cough. She is staying active--some walking, Chief Strategy Officer (YouTube video)--30-45 minutes 3-4/week (includes weights).  Hyperlipidemia: In past LDL and TG have been elevated. She takes fish oil sporadically. Has been eating healthier. She tries to limit her cheese, is better about avoiding other cholesterol-rich foods. She continues to drink 2 drinks each night (wine).  GERD: Well controlled with Nexium, needing it just every other day. No longer needing zantac prn. Reports "choking" occasionally, which she related to postnasal drainage.  Impaired fasting glucose: Tries to limit carbs/sweets/sugar.  Allergies:  Seasonal, in the spring.   L sensorineural hearing loss, acute onset, for which she saw ENT (Dr. Lucia Gaskins).  She was treated with steroids x 2 weeks, and she reports her hearing resolved on the 14th day.

## 2015-12-02 ENCOUNTER — Encounter: Payer: 59 | Admitting: Family Medicine

## 2016-02-25 ENCOUNTER — Other Ambulatory Visit: Payer: Self-pay | Admitting: Family Medicine

## 2016-02-26 NOTE — Progress Notes (Signed)
Chief Complaint  Patient presents with  . Hypertension    nonfasting med check.    She was last seen for CPE in 05/2015.  She never came for her fasting labs.  Last labs were done 04/2014. She is not fasting today--last ate about 3 hours prior to visit.  Hypertension. She doesn't check her BP elsewhere.  She has a monitor, not checking recently (because hasn't been checking her mother's as often). She is now compliant in taking her medication every day (only taking qod at last visit).  Denies dizziness, headaches, chest pain. Denies side effects of medications, cough. She is staying active--some walking, Chief Strategy Officer (YouTube video)--30-45 minutes 3-4/week (includes weights). She gained some weight when she quit smoking.  Hyperlipidemia: In past LDL and TG have been elevated. She stopped taking fish oil. Has been eating healthier. She tries to limit her cheese, is better about avoiding other cholesterol-rich foods. She is watching the sodium levels in her diet. She continues to drink 2 drinks each night ( 6 ounce glasses of wine). Lab Results  Component Value Date   CHOL 231 (H) 05/04/2014   HDL 64 05/04/2014   LDLCALC 140 (H) 05/04/2014   TRIG 134 05/04/2014   CHOLHDL 3.6 05/04/2014    GERD: She switched to Nexium OTC. It is half the strength of the prescription she was taking, needs to take it daily.  She has recurrent symptoms with missed doses.    She intermittently has some trouble swallowing, "choking" which she relates to postnasal drainage, not reflux.  She was taking claritin daily. She thinks she built up a tolerance, so stopped it.  Symptoms are finally subsiding. Denies dysphagia in chest. carbs seems to flare up her reflux (bagels were the worst), so she limits her carbs to just one meal/day.  Impaired fasting glucose:  H/o GDM. Tries to limit carbs/sweets/sugar. Lab Results  Component Value Date   HGBA1C 5.6 05/04/2014   Allergies:  Seasonal, in the spring  usually, but this fall was pretty bad also. Within the last year she had L sensorineural hearing loss, acute onset, for which she saw ENT (Dr. Lucia Gaskins).  She was treated with steroids x 2 weeks, and she reports her hearing resolved on the 14th day. Denies any recurrent problems with hearing loss.  PMH ,PSH, SH and FH reviewed and updated  Outpatient Encounter Prescriptions as of 02/27/2016  Medication Sig Note  . aspirin 81 MG tablet Take 81 mg by mouth daily.    Marland Kitchen esomeprazole (NEXIUM) 20 MG capsule Take 20 mg by mouth daily at 12 noon.    Marland Kitchen lisinopril (PRINIVIL,ZESTRIL) 10 MG tablet Take 1 tablet (10 mg total) by mouth daily.   . Multiple Vitamins-Minerals (WOMENS 50+ MULTI VITAMIN/MIN) TABS Take 1 tablet by mouth daily. 05/30/2015: Only remembers every other day  . NON FORMULARY Take 1 capsule by mouth daily. 02/14/2015: Tumeric 1000mg   . vitamin B-12 (CYANOCOBALAMIN) 1000 MCG tablet Take 1,000 mcg by mouth daily. Reported on 05/30/2015   . [DISCONTINUED] esomeprazole (NEXIUM) 40 MG capsule TAKE ONE CAPSULE BY MOUTH EVERY DAY BEFORE SUPPER   . [DISCONTINUED] esomeprazole (NEXIUM) 40 MG capsule TAKE ONE CAPSULE BY MOUTH EVERY DAY BEFORE SUPPER   . [DISCONTINUED] lisinopril (PRINIVIL,ZESTRIL) 10 MG tablet TAKE 1 TABLET BY MOUTH EVERY DAY   . [DISCONTINUED] loratadine (CLARITIN) 10 MG tablet Take 10 mg by mouth daily. Reported on 05/30/2015 05/30/2015: Uses seasonally, spring and summer   No facility-administered encounter medications on file as of 02/27/2016.  Allergies  Allergen Reactions  . Codeine Other (See Comments)    Would make her start her period. Also just affected her body poorly so she just does not take it.  Marland Kitchen Hctz [Hydrochlorothiazide] Other (See Comments)    Hyponatremia   . Latex Rash  . Penicillins Rash    ROS:  No fevers, chills, URI symptoms (just some residual allergic symptoms/congestion), headaches, dizziness, chest pain, shortness of breath, nausea, vomiting, bowel  changes, urinary complaints, joint pains, bleeding, bruises, rashes, depression or other concerns. See HPI. +congestion  PHYSICAL EXAM:  BP 138/80 (BP Location: Left Arm, Patient Position: Sitting, Cuff Size: Normal)   Pulse 88   Ht 5' 4.5" (1.638 m)   Wt 182 lb 9.6 oz (82.8 kg)   BMI 30.86 kg/m   Well developed, pleasant female in no distress HEENT: PERRL, EOMI, TM's and EAC's normal (slight effusion on the left, no erythema).  conjunctiva clear. Nasal mucosa mild-mod edematous, no purulence.  Sinuses nontender.  OP is clear Neck: no lymphadenopathy, thyromegaly or mass Heart: Soft murmur at RUSB.  Regular rate and rhythm Lungs: clear bilaterally Back: no CVA tenderness Abdomen: soft, nontender, no organomegaly or mass Extremities: no edema Psych: normal mood, affect, hygiene and grooming Neuro: alert and oriented.  Cranial nerves intact; normal gait, strength.   Lab Results  Component Value Date   HGBA1C 5.5 02/27/2016     ASSESSMENT/PLAN:  Mixed hyperlipidemia - nonfasting today--TG might be elevated.  reviewed lowfat diet, consider restarting fish oil.  If very high, return for fasting panel - Plan: Lipid panel  Impaired fasting glucose - Plan: HgB A1c  Essential hypertension, benign - borderline today. reviewed low sodium diet, regular exercise, weight loss. Monitor at home periodically; f/u if remains elevated (was higher on repeat by MD) - Plan: lisinopril (PRINIVIL,ZESTRIL) 10 MG tablet, Comprehensive metabolic panel  Gastroesophageal reflux disease, esophagitis presence not specified  Need for prophylactic vaccination and inoculation against influenza - Plan: Flu Vaccine QUAD 36+ mos PF IM (Fluarix & Fluzone Quad PF)  Essential hypertension, benign - . - Plan: lisinopril (PRINIVIL,ZESTRIL) 10 MG tablet, Comprehensive metabolic panel  Annual physical exam - Plan: CBC with Differential/Platelet, Lipid panel, VITAMIN D 25 Hydroxy (Vit-D Deficiency, Fractures), TSH,  Comprehensive metabolic panel, Hepatitis C antibody  Other fatigue - Plan: CBC with Differential/Platelet, VITAMIN D 25 Hydroxy (Vit-D Deficiency, Fractures), TSH, Comprehensive metabolic panel  Medication monitoring encounter - Plan: CBC with Differential/Platelet, Comprehensive metabolic panel  Mixed hyperlipidemia - lowfat, low cholesterol diet reviewed. recheck - Plan: Lipid panel  Goiter - Plan: TSH  Need for hepatitis C screening test - Plan: Hepatitis C antibody   Consider switching your antihistamine to see if a different one is more effective.  Zyrtec and Allegra are also over-the-counter.  Consider adding a nasal steroid spray such as Flonase or Nasonex if your allergies aren't well controlled with an antihistamine alone.  Remember that the spray needs to be used regularly, with gentle sniffs (the antihistamines work faster, and can be used just as needed).  Consider using mucinex if/when you feel like the post nasal drainage is causing significant cough or "choking".

## 2016-02-27 ENCOUNTER — Telehealth: Payer: Self-pay | Admitting: *Deleted

## 2016-02-27 ENCOUNTER — Encounter: Payer: Self-pay | Admitting: Family Medicine

## 2016-02-27 ENCOUNTER — Ambulatory Visit (INDEPENDENT_AMBULATORY_CARE_PROVIDER_SITE_OTHER): Payer: 59 | Admitting: Family Medicine

## 2016-02-27 VITALS — BP 138/80 | HR 88 | Ht 64.5 in | Wt 182.6 lb

## 2016-02-27 DIAGNOSIS — Z23 Encounter for immunization: Secondary | ICD-10-CM

## 2016-02-27 DIAGNOSIS — R5383 Other fatigue: Secondary | ICD-10-CM | POA: Diagnosis not present

## 2016-02-27 DIAGNOSIS — Z5181 Encounter for therapeutic drug level monitoring: Secondary | ICD-10-CM | POA: Diagnosis not present

## 2016-02-27 DIAGNOSIS — E559 Vitamin D deficiency, unspecified: Secondary | ICD-10-CM

## 2016-02-27 DIAGNOSIS — E782 Mixed hyperlipidemia: Secondary | ICD-10-CM

## 2016-02-27 DIAGNOSIS — K219 Gastro-esophageal reflux disease without esophagitis: Secondary | ICD-10-CM

## 2016-02-27 DIAGNOSIS — E049 Nontoxic goiter, unspecified: Secondary | ICD-10-CM | POA: Diagnosis not present

## 2016-02-27 DIAGNOSIS — Z0000 Encounter for general adult medical examination without abnormal findings: Secondary | ICD-10-CM | POA: Diagnosis not present

## 2016-02-27 DIAGNOSIS — I10 Essential (primary) hypertension: Secondary | ICD-10-CM

## 2016-02-27 DIAGNOSIS — Z1159 Encounter for screening for other viral diseases: Secondary | ICD-10-CM | POA: Diagnosis not present

## 2016-02-27 DIAGNOSIS — R7301 Impaired fasting glucose: Secondary | ICD-10-CM

## 2016-02-27 LAB — LIPID PANEL
CHOL/HDL RATIO: 4.3 ratio (ref <5.0)
CHOLESTEROL: 272 mg/dL — AB (ref <200)
HDL: 63 mg/dL (ref >50)
LDL Cholesterol: 160 mg/dL — ABNORMAL HIGH
TRIGLYCERIDES: 246 mg/dL — AB (ref <150)
VLDL: 49 mg/dL — ABNORMAL HIGH (ref <30)

## 2016-02-27 LAB — CBC WITH DIFFERENTIAL/PLATELET
BASOS ABS: 0 {cells}/uL (ref 0–200)
Basophils Relative: 0 %
EOS PCT: 2 %
Eosinophils Absolute: 150 cells/uL (ref 15–500)
HCT: 40.8 % (ref 35.0–45.0)
Hemoglobin: 13.8 g/dL (ref 11.7–15.5)
LYMPHS ABS: 2175 {cells}/uL (ref 850–3900)
Lymphocytes Relative: 29 %
MCH: 31.9 pg (ref 27.0–33.0)
MCHC: 33.8 g/dL (ref 32.0–36.0)
MCV: 94.2 fL (ref 80.0–100.0)
MPV: 9.7 fL (ref 7.5–12.5)
Monocytes Absolute: 450 cells/uL (ref 200–950)
Monocytes Relative: 6 %
NEUTROS ABS: 4725 {cells}/uL (ref 1500–7800)
Neutrophils Relative %: 63 %
PLATELETS: 289 10*3/uL (ref 140–400)
RBC: 4.33 MIL/uL (ref 3.80–5.10)
RDW: 13 % (ref 11.0–15.0)
WBC: 7.5 10*3/uL (ref 4.0–10.5)

## 2016-02-27 LAB — COMPREHENSIVE METABOLIC PANEL
ALBUMIN: 4.5 g/dL (ref 3.6–5.1)
ALK PHOS: 63 U/L (ref 33–130)
ALT: 28 U/L (ref 6–29)
AST: 18 U/L (ref 10–35)
BILIRUBIN TOTAL: 0.3 mg/dL (ref 0.2–1.2)
BUN: 14 mg/dL (ref 7–25)
CO2: 23 mmol/L (ref 20–31)
CREATININE: 0.89 mg/dL (ref 0.50–1.05)
Calcium: 9.7 mg/dL (ref 8.6–10.4)
Chloride: 106 mmol/L (ref 98–110)
Glucose, Bld: 94 mg/dL (ref 65–99)
Potassium: 3.8 mmol/L (ref 3.5–5.3)
SODIUM: 140 mmol/L (ref 135–146)
TOTAL PROTEIN: 6.9 g/dL (ref 6.1–8.1)

## 2016-02-27 LAB — POCT GLYCOSYLATED HEMOGLOBIN (HGB A1C): HEMOGLOBIN A1C: 5.5

## 2016-02-27 LAB — TSH: TSH: 0.75 mIU/L

## 2016-02-27 MED ORDER — LISINOPRIL 10 MG PO TABS: 10.0000 mg | ORAL_TABLET | Freq: Every day | ORAL | 1 refills | Status: DC

## 2016-02-27 NOTE — Patient Instructions (Addendum)
Continue to follow a low sodium diet.  Periodically monitor your blood pressure and let us know if it is running consistently over 140/90.  Try and increase your exercise to get a total of 150 minutes of aerobic exercise each week.  Consider switching your antihistamine to see if a different one is more effective.  Zyrtec and Allegra are also over-the-counter.  Consider adding a nasal steroid spray such as Flonase or Nasonex if your allergies aren't well controlled with an antihistamine alone.  Remember that the spray needs to be used regularly, with gentle sniffs (the antihistamines work faster, and can be used just as needed).  Consider using mucinex if/when you feel like the post nasal drainage is causing significant cough or "choking".

## 2016-02-27 NOTE — Telephone Encounter (Signed)
Error

## 2016-02-28 DIAGNOSIS — E559 Vitamin D deficiency, unspecified: Secondary | ICD-10-CM | POA: Insufficient documentation

## 2016-02-28 LAB — VITAMIN D 25 HYDROXY (VIT D DEFICIENCY, FRACTURES): Vit D, 25-Hydroxy: 11 ng/mL — ABNORMAL LOW (ref 30–100)

## 2016-02-28 LAB — HEPATITIS C ANTIBODY: HCV AB: NEGATIVE

## 2016-02-28 MED ORDER — VITAMIN D (ERGOCALCIFEROL) 1.25 MG (50000 UNIT) PO CAPS: 50000.0000 [IU] | ORAL_CAPSULE | ORAL | 0 refills | Status: DC

## 2016-02-28 NOTE — Addendum Note (Signed)
Addended by: Rita Ohara on: 02/28/2016 08:15 AM   Modules accepted: Orders

## 2016-03-20 DIAGNOSIS — S76319A Strain of muscle, fascia and tendon of the posterior muscle group at thigh level, unspecified thigh, initial encounter: Secondary | ICD-10-CM

## 2016-03-20 HISTORY — DX: Strain of muscle, fascia and tendon of the posterior muscle group at thigh level, unspecified thigh, initial encounter: S76.319A

## 2016-03-26 ENCOUNTER — Encounter: Payer: Self-pay | Admitting: Family Medicine

## 2016-04-18 ENCOUNTER — Encounter (HOSPITAL_COMMUNITY): Payer: Self-pay | Admitting: Emergency Medicine

## 2016-04-18 ENCOUNTER — Emergency Department (HOSPITAL_COMMUNITY)
Admission: EM | Admit: 2016-04-18 | Discharge: 2016-04-18 | Disposition: A | Discharge status: Home / Self Care | Payer: 59 | Attending: Emergency Medicine | Admitting: Emergency Medicine

## 2016-04-18 DIAGNOSIS — Z87891 Personal history of nicotine dependence: Secondary | ICD-10-CM | POA: Diagnosis not present

## 2016-04-18 DIAGNOSIS — Y9389 Activity, other specified: Secondary | ICD-10-CM | POA: Insufficient documentation

## 2016-04-18 DIAGNOSIS — Y929 Unspecified place or not applicable: Secondary | ICD-10-CM | POA: Diagnosis not present

## 2016-04-18 DIAGNOSIS — Z91040 Latex allergy status: Secondary | ICD-10-CM | POA: Insufficient documentation

## 2016-04-18 DIAGNOSIS — W010XXA Fall on same level from slipping, tripping and stumbling without subsequent striking against object, initial encounter: Secondary | ICD-10-CM | POA: Diagnosis not present

## 2016-04-18 DIAGNOSIS — S76312A Strain of muscle, fascia and tendon of the posterior muscle group at thigh level, left thigh, initial encounter: Secondary | ICD-10-CM

## 2016-04-18 DIAGNOSIS — Y999 Unspecified external cause status: Secondary | ICD-10-CM | POA: Diagnosis not present

## 2016-04-18 DIAGNOSIS — I10 Essential (primary) hypertension: Secondary | ICD-10-CM | POA: Diagnosis not present

## 2016-04-18 DIAGNOSIS — Z7982 Long term (current) use of aspirin: Secondary | ICD-10-CM | POA: Diagnosis not present

## 2016-04-18 DIAGNOSIS — S76812A Strain of other specified muscles, fascia and tendons at thigh level, left thigh, initial encounter: Secondary | ICD-10-CM | POA: Insufficient documentation

## 2016-04-18 DIAGNOSIS — S79922A Unspecified injury of left thigh, initial encounter: Secondary | ICD-10-CM | POA: Diagnosis present

## 2016-04-18 MED ORDER — METHOCARBAMOL 500 MG PO TABS: 500.0000 mg | ORAL_TABLET | Freq: Two times a day (BID) | ORAL | 0 refills | Status: DC

## 2016-04-18 NOTE — Discharge Instructions (Signed)
Take the prescribed medication as directed.  May take motrin with this to help reduce inflammation.  Recommend to ice and elevate at home to help with pain. Leg will likely be sore for a few days but should improve gradually. Follow-up with your primary care doctor. Return to the ED for new or worsening symptoms.

## 2016-04-18 NOTE — ED Provider Notes (Signed)
[facility] DEPT Provider Note   CSN: [id number] Arrival date & time: 04/18/16  1032  By signing my name below, I, Higinio Plan, attest that this documentation has been prepared under the direction and in the presence of Quincy Carnes, PA-C.  Electronically Signed: Higinio Plan, ED Scribe. 04/18/16. 10:54 AM.  History   Chief Complaint Chief Complaint  Patient presents with  . Leg Pain   The history is provided by the patient. No language interpreter was used.   HPI Comments: Laura Payne is a 60 y.o. female with PMHx of HTN and sciatica in her left leg, brought in by EMS to the Emergency Department complaining of sudden onset, pain to her left posterior thigh s/p an injury that occurred this morning. Pt reports she was attempting to unplug her Christmas tree this morning when she suddenly "slipped and fell into a split," causing pain in her left posterior leg. She notes she was unable to get up from the ground on her own and required help from her son to ambulate. She states her son also wrapped her leg in a compression bandage prior to visiting the ED with mild relief. She denies any head injury or loss of consciousness.    Past Medical History:  Diagnosis Date  . Allergy   . Dyslipidemia    hypercholesterolemia  . GERD (gastroesophageal reflux disease)   . Goiter    u/s 07/2004, generalized enlargement without cysts or nodules; normal TSH  . 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 Hx gestational diabetes   . Hypertension   . Lipoma of axilla    h/o left  . Low sodium levels    history of low sodium with diuretics in the past.   Patient Active Problem List   Diagnosis Date Noted  . Vitamin D deficiency 02/28/2016  . Rhinitis, allergic 05/07/2014  . Mixed hyperlipidemia 11/06/2013  . Hypertriglyceridemia 04/05/2013  . Alcohol abuse, daily use 11/25/2012  . GERD (gastroesophageal reflux disease) 06/01/2012  . Impaired fasting  glucose 06/01/2012  . Pure hypercholesterolemia 05/04/2011  . Goiter 05/04/2011  . Essential hypertension, benign 05/04/2011   Past Surgical History:  Procedure Laterality Date  . TONSILLECTOMY      OB History    Gravida Para Term Preterm AB Living   4       3 1    SAB TAB Ectopic Multiple Live Births   1 2           Home Medications    Prior to Admission medications   Medication Sig Start Date End Date Taking? Authorizing Provider  aspirin 81 MG tablet Take 81 mg by mouth daily.     Historical Provider, MD  esomeprazole (NEXIUM) 20 MG capsule Take 20 mg by mouth daily at 12 noon.     Historical Provider, MD  lisinopril (PRINIVIL,ZESTRIL) 10 MG tablet Take 1 tablet (10 mg total) by mouth daily. 02/27/16   Rita Ohara, MD  Multiple Vitamins-Minerals (WOMENS 50+ MULTI VITAMIN/MIN) TABS Take 1 tablet by mouth daily.    Historical Provider, MD  NON FORMULARY Take 1 capsule by mouth daily.    Historical Provider, MD  vitamin B-12 (CYANOCOBALAMIN) 1000 MCG tablet Take 1,000 mcg by mouth daily. Reported on 05/30/2015    Historical Provider, MD  Vitamin D, Ergocalciferol, (DRISDOL) 50000 units CAPS capsule Take 1 capsule (50,000 Units total) by mouth every 7 (seven) days. 02/28/16   Rita Ohara, MD  Family History Family History  Problem Relation Age of Onset  . Osteoporosis Mother   . Dementia Mother   . Transient ischemic attack Mother   . Hypertension Mother   . Heart disease Mother     atrial fibrillation  . Atrial fibrillation Mother   . Cancer Father 2    osteosarcoma, metastasized to lung  . Crohn's disease Cousin   . Psoriasis Cousin   . Diabetes Maternal Uncle   . Cancer Paternal Uncle 4    colon  . Cancer Maternal Grandfather     throat  . Breast cancer Paternal Grandmother 42  . Arthritis Cousin     rheumatoid  . Diabetes Cousin     type 1    Social History Social History  Substance Use Topics  . Smoking status: Former Smoker    Packs/day: 0.10    Years:  41.00    Types: Cigarettes    Quit date: 10/07/2013  . Smokeless tobacco: Never Used  . Alcohol use 0.0 oz/week     Comment: 2 glasses of wine every evening (12 ounce total)   Allergies   Codeine; Hctz [hydrochlorothiazide]; Latex; and Penicillins   Review of Systems Review of Systems  Musculoskeletal: Positive for myalgias.  Neurological: Negative for syncope.  All other systems reviewed and are negative.  Physical Exam Updated Vital Signs BP 156/87   Pulse 89   Temp 98.4 F (36.9 C) (Oral)   Resp 18   Ht 5\' 6"  (1.676 m)   Wt 170 lb (77.1 kg)   SpO2 96%   BMI 27.44 kg/m   Physical Exam  Constitutional: She is oriented to person, place, and time. She appears well-developed and well-nourished.  HENT:  Head: Normocephalic and atraumatic.  Mouth/Throat: Oropharynx is clear and moist.  Eyes: Conjunctivae and EOM are normal. Pupils are equal, round, and reactive to light.  Neck: Normal range of motion.  Cardiovascular: Normal rate, regular rhythm and normal heart sounds.   Pulmonary/Chest: Effort normal and breath sounds normal.  Abdominal: Soft. Bowel sounds are normal.  Musculoskeletal: Normal range of motion. She exhibits tenderness.  Left posterior thigh normal in appearance without bruising, swelling, or muscle bulges;  there is tenderness along mid-hamstring; hamstring, quadriceps, and gastrocnemius muscle area all contracting normally; able to flex and extend knee and hip equally without pain elicited; leg is NVI; compartments are soft, easily compressible  Neurological: She is alert and oriented to person, place, and time.  Skin: Skin is warm and dry.  Psychiatric: She has a normal mood and affect.  Nursing note and vitals reviewed.  ED Treatments / Results  Labs (all labs ordered are listed, but only abnormal results are displayed) Labs Reviewed - No data to display  EKG  EKG Interpretation None       Radiology No results found.  Procedures Procedures  (including critical care time)  Medications Ordered in ED Medications - No data to display  DIAGNOSTIC STUDIES:  Oxygen Saturation is 96% on RA, normal by my interpretation.    COORDINATION OF CARE:  10:52 AM Discussed treatment plan with pt at bedside and pt agreed to plan.  Initial Impression / Assessment and Plan / ED Course  I have reviewed the triage vital signs and the nursing notes.  Pertinent labs & imaging results that were available during my care of the patient were reviewed by me and considered in my medical decision making (see chart for details).  Clinical Course    60 year old female here  with likely hamstring strain after doing a split morning while trying to unplug her christmas tree. There is tenderness along her left mid hamstring without visible bruising, swelling, or muscle bulge. Her hamstring, quadriceps, and gastrocnemius muscles are all contracting normally. Her leg is neurovascularly intact. Compartments are soft and easily compressible.  Will treat symptomatically-- robaxin, RICE routine, anti-inflammatories.  Will likely have soreness for a few days, should gradually improve.  Recommended follow-up with PCP.  Discussed plan with patient, she acknowledged understanding and agreed with plan of care.  Return precautions given for new or worsening symptoms.  Final Clinical Impressions(s) / ED Diagnoses   Final diagnoses:  Hamstring muscle strain, left, initial encounter    New Prescriptions New Prescriptions   METHOCARBAMOL (ROBAXIN) 500 MG TABLET    Take 1 tablet (500 mg total) by mouth 2 (two) times daily.   I personally performed the services described in this documentation, which was scribed in my presence. The recorded information has been reviewed and is accurate.   Larene Pickett, PA-C 04/18/16 [place], MD 04/21/16 [phone]

## 2016-04-18 NOTE — ED Triage Notes (Signed)
Per EMS, patient from home, patient reports left hamstring pain after falling into a "split" while trying to unplug her Christmas tree. Denies head injury and LOC.

## 2016-05-08 ENCOUNTER — Telehealth: Payer: Self-pay | Admitting: Family Medicine

## 2016-05-08 NOTE — Telephone Encounter (Signed)
Pt made aware of recommendations. She was made aware of sx in both flu and stomach virus. She will try BRAT diet and immodium for sx management. Laura Payne

## 2016-05-08 NOTE — Telephone Encounter (Signed)
LMTCB

## 2016-05-08 NOTE — Telephone Encounter (Signed)
Pt called back stating that she has a fever of 100, diarrhea, stomach pain, body ache. Symptoms started at 2:00 am this morning. Pt is a caregiver. She confirmed the she DID get the flu shot. Pt would like to get Tamiflu script if possible.

## 2016-05-08 NOTE — Telephone Encounter (Signed)
Diarrhea and stomach pain isn't classic for influenza--this is more typically related to the stomach flu, which is entirely different, and doesn't respond to tamiflu.  Typical influenza symptoms are high fevers (usually over 102), with body aches (feeling like you're run over by a truck), cough and respiratory symptoms. The flu shot isn't very effective this year, so having the shot may not prevent influenza.    For her GI symptoms, have her follow a BRAT diet (bananas, rice, applesauce and toast), and avoid dairy. She may use imodium prn diarrhea.

## 2016-05-08 NOTE — Telephone Encounter (Signed)
Pt called and left message on answering machine requesting script for Tamiflu also stated that she has not had the flu shot and she has the flu. Called pt back to get more info. Left message for her to call back.

## 2016-05-28 ENCOUNTER — Other Ambulatory Visit: Payer: Self-pay

## 2016-06-29 ENCOUNTER — Encounter: Payer: Self-pay | Admitting: Family Medicine

## 2016-06-29 DIAGNOSIS — M5417 Radiculopathy, lumbosacral region: Secondary | ICD-10-CM | POA: Insufficient documentation

## 2016-09-06 NOTE — Progress Notes (Addendum)
Chief Complaint  Patient presents with  . Annual Exam    fasting annual exam, no pap sees Dr Julien Girt. Sees Dr Sabra Heck for eye exams. 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     Laura Payne is a 61 y.o. female who presents for a complete physical.    She reports that she tore her left hamstring over the holidays (tripped and fell into a split).  She fell again, and pain in left leg was getting worse.  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  They offered injections or surgery, but "pain level doesn't warrant that'.  Some discomfort in the left foot, tingling in the left thigh.  She has been doing home exercises.  Vitamin D deficiency:  Level was low at 80 in November. She was treated with 12 weeks of prescription vitamin D.  She admits she never started taking OTC Vitamin D. She was supposed to return in February for recheck, was dealing with hamstring issue and never came.  Hypertension. [place] is 13580 when checked at work. She is not checking BP at home.  She has some stress at [facility] is suing her. Compliant with taking her medication daily. Denies dizziness, headaches, chest pain. Denies side effects of medications, cough. She is staying active, mostly just doing her physical therapy exercise and walking on her treadmill 30 minutes qod.  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 She stopped taking fish oil. Last check was in [facility] hadn't been fasting so TG was expected to be elevated some, but her LDL had gotten wore as well.  She was supposed to return in 3-4 mos for recheck lipids  (fasting), but as stated above, did not.  She follows a mediterranean diet, tries to keep it low cholesterol, lowfat.  She does eat some cheese, tries to limit. She continues to drink 2 drinks each night ( 6 ounce glasses of wine). Lab Results  Component Value Date   CHOL 272 (H) 02/27/2016   HDL 63 02/27/2016   LDLCALC 160 (H) 02/27/2016   TRIG 246 (H) 02/27/2016   CHOLHDL 4.3 02/27/2016    GERD: She has been taking Nexium OTC with good results, needs to take it daily.  She has recurrent symptoms with missed doses.  (previously took rx doses, no longer needed).  She intermittently has some trouble swallowing, "choking" which she relates to postnasal drainage, not reflux. She hasn't been taking anything this year for allergies, doesn't seem to be as bad. (she previously took claritin daily in the Spring). Avoids carbs, bagels, as it triggers reflux, dysphagia.  Impaired fasting glucose:  H/o GDM. Tries to limit carbs/sweets/sugar. Lab Results  Component Value Date   HGBA1C 5.5 02/27/2016    Immunization History  Administered Date(s) Administered  . Influenza Split 05/04/2011  . Influenza,inj,Quad PF,36+ Mos 04/05/2013, 05/07/2014, 02/14/2015, 02/27/2016  . Tdap 10/19/2007   Last Pap smear: with Dr. Toya Smothers reports likely 2015; due to schedule Last mammogram: 07/2015. Just changed insurance and plans to schedule Last colonoscopy: age 22 (11/2007, Dr. Oletta Lamas) Last DEXA: Never Dentist: twice yearly Ophtho:  yearly Exercise: treadmill 30 minutes every other day and home exercise program from PT.  Past Medical History:  Diagnosis Date  . Allergy   . Dyslipidemia    hypercholesterolemia  . GERD (gastroesophageal reflux disease)   . Goiter    u/s 07/2004, generalized enlargement without cysts or nodules; normal TSH  . 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 Hx gestational diabetes   . Hypertension   . Lipoma of axilla     h/o left  . Low sodium levels    history of low sodium with diuretics in the past.    Past Surgical History:  Procedure Laterality Date  . TONSILLECTOMY      Social History   Social History  . Marital status: Divorced    Spouse name: N/A  . Number of children: 1  . Years of education: N/A   Occupational History  . [employer]   Social History Main Topics  . Smoking status: Former Smoker    Packs/day: 0.10    Years: 41.00    Types: Cigarettes    Quit date: 10/07/2013  . Smokeless tobacco: Never Used  . Alcohol use 0.0 oz/week     Comment: 2 glasses of wine every evening (12 ounce total)  . Drug use: No  . Sexual activity: Yes   Other Topics Concern  . Not on file   Social History Narrative   Lives with her son. Son works for her (did not finish Water quality scientist). no pets.   Architect. 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 Monogamous, committed relationship    Family History  Problem Relation Age of Onset  . Osteoporosis Mother   . Dementia Mother   . Transient ischemic attack Mother   . Hypertension Mother   . Heart disease Mother        atrial fibrillation  . Atrial fibrillation Mother   . Cancer Father 49       osteosarcoma, metastasized to lung  . Crohn's disease Cousin   . Psoriasis Cousin   . Diabetes Maternal Uncle   . Cancer Paternal Uncle 31       colon  . Cancer Maternal Grandfather        throat  . Breast cancer Paternal Grandmother 75  . Arthritis Cousin        rheumatoid  . Diabetes Cousin        type 1    Outpatient Encounter Prescriptions as of 09/07/2016  Medication Sig Note  . aspirin 81 MG tablet Take 81 mg by mouth daily.    Marland Kitchen esomeprazole (NEXIUM) 20 MG capsule Take 20 mg by mouth daily at 12 noon.    Marland Kitchen lisinopril (PRINIVIL,ZESTRIL) 10 MG tablet Take 1 tablet (10 mg total) by mouth daily.   . Multiple Vitamins-Minerals (WOMENS 50+ MULTI VITAMIN/MIN) TABS Take 1 tablet by mouth  daily.   . NON FORMULARY Take 1 capsule by mouth daily. 02/14/2015: Tumeric 1048m  . [DISCONTINUED] methocarbamol (ROBAXIN) 500 MG tablet Take 1 tablet (500 mg total) by mouth 2 (two) times daily.   . [DISCONTINUED] vitamin B-12 (CYANOCOBALAMIN) 1000 MCG tablet Take 1,000 mcg by mouth daily. Reported on 05/30/2015   . [DISCONTINUED] Vitamin D, Ergocalciferol, (DRISDOL) 50000 units CAPS capsule Take 1 capsule (50,000 Units total) by mouth every 7 (seven) days.    No facility-administered encounter medications on file as of 09/07/2016.  Allergies  Allergen Reactions  . Codeine Other (See Comments)    Would make her start her period. Also just affected her body poorly so she just does not take it.  Marland Kitchen Hctz [Hydrochlorothiazide] Other (See Comments)    Hyponatremia   . Latex Rash  . Penicillins Rash    ROS: The patient denies anorexia, weight changes,vision changes, ear pain, breast concerns, chest pain, dizziness, syncope, dyspnea on exertion, swelling, nausea, vomiting, diarrhea, constipation, abdominal pain, melena, hematochezia, hematuria, incontinence, dysuria, vaginal bleeding, discharge, odor or itch, genital lesions, weakness, tremor, suspicious skin lesions, depression, anxiety, abnormal bleeding/bruising, or enlarged lymph nodes.  H/o arms aching (with brushing/washing hair)--resolved since taking Tumeric Postmenopausal, no bleeding, +hot flashes, mild, tolerable night sweats. Left hip/buttock pain in the mornings, relieved by stretches (unchanged) Controlled reflux, see HPI. Dysphagia with carbs/bagels, per HPI Tingling left thigh; denies leg weakness Occasional palpitation while driving.  PHYSICAL EXAM:  BP 122/76 (BP Location: Left Arm, Patient Position: Sitting, Cuff Size: Normal)   Pulse 72   Ht _0  (1.651 m)   Wt 172 lb 12.8 oz (78.4 kg)   BMI 28.76 kg/m    Wt Readings from Last 3 Encounters:  04/18/16 170 lb (77.1 kg)  02/27/16 182 lb 9.6 oz (82.8 kg)   05/30/15 177 lb 6.4 oz (80.5 kg)   General Appearance:   Alert, cooperative, no distress, appears stated age  Head:   Normocephalic, without obvious abnormality, atraumatic  Eyes:   PERRL, conjunctiva/corneas clear, EOM's intact, fundi benign  Ears:   Normal TM's and external ear canals  Nose:  Nares normal, mucosa mildly edematous, no erythema, no purulent drainage; no sinus tenderness  Throat:  Lips, mucosa, and tongue normal; teeth and gums normal  Neck:  Supple, no lymphadenopathy; thyroid: nosignificant enlargement/tenderness/nodules; no carotidbruit or JVD  Back:  Spine nontender, no curvature, ROM normal, no CVA tenderness  Lungs:   Clear to auscultation bilaterally without wheezes, rales or ronchi; respirations unlabored  Chest Wall:   No tenderness or deformity  Heart:   Regular rate and rhythm, S1 and S2 normal, no murmur, rub or gallop  Breast Exam:   Deferred to GYN  Abdomen:   Soft, non-tender, nondistended, normoactive bowel sounds, no masses, no hepatosplenomegaly  Genitalia:   Deferred to GYN     Extremities:  No clubbing, cyanosis or edema  Pulses:  2+ and symmetric all extremities  Skin:  Skin color, texture, turgor normal, no rashes or lesions. Scattered cherry angiomas  Lymph nodes:  Cervical, supraclavicular, and axillary nodes normal  Neurologic:  CNII-XII intact, normal strength, sensation and gait; reflexes 2+ and symmetric at knees, diminished at ankles bilaterally, symmetric.   Psych: Normal mood, affect, hygiene and grooming    ASSESSMENT/PLAN:  Annual physical exam - Plan: POCT Urinalysis Dipstick  Mixed hyperlipidemia - due for recheck; diet sounds appropriate.  May need medications if not at goal - Plan: Lipid panel, rosuvastatin (CRESTOR) 10 MG tablet  Essential hypertension, benign - well controlled (normal here, higher at work, but some work stress there).  -  Plan: lisinopril (PRINIVIL,ZESTRIL) 10 MG tablet  Gastroesophageal reflux disease, esophagitis presence not specified - controlled with OTC meds and diet  Vitamin D deficiency - noncompliant with OTC supplement; due for recheck - Plan: VITAMIN D 25 Hydroxy (Vit-D Deficiency, Fractures), Vitamin D, Ergocalciferol, (DRISDOL) 50000 units CAPS capsule  Impaired fasting glucose - normal A1c in November; encouraged weight loss, cutting back on alcohol  Essential hypertension, benign - borderline  today. reviewed low sodium diet, regular exercise, weight loss. Monitor at home periodically; f/u if remains elevated (was higher on repeat by MD) - Plan: lisinopril (PRINIVIL,ZESTRIL) 10 MG tablet  Overweight (BMI 25.0-29.9) - discussed diet/exercise in detail; to cut back on alcohol. Weight loss encouraged   Discussed yearly vs q6 mos visit.  If labs normal, and BP's remain normal, can f/u yearly for physical.  Before next visit-C-met, CBC, lipids, A1c, TSH, Vit D.   Discussed monthly self breast exams and yearly mammograms (pt advised to give my name as well as her GYN's to [facility] when she has her mammograms); at least 30 minutes of aerobic activity at least 5 days/week, weight-bearing exercise at least 2x/wk; proper sunscreen use reviewed; healthy diet, including goals of calcium and vitamin D intake and alcohol recommendations (less than or equal to 1 drink/day) reviewed; regular seatbelt use; changing batteries in smoke detectors. Immunization recommendations discussed, UTD.Shingrix recommended and risks/side effects reviewed.  Will check insurance and return for [place]. Colonoscopy recommendations reviewed, due again next year.   Addendum: Vitamin D remained low, as expected, refill another 12 weeks rx, followed by 1000 IU daily longterm, OTC  Lipids were high: Lab Results  Component Value Date   CHOL 296 (H) 09/07/2016   HDL 70 09/07/2016   LDLCALC 177 (H) 09/07/2016   TRIG 243 (H) 09/07/2016    CHOLHDL 4.2 09/07/2016   Recommended starting Crestor and f/u lab visit 2 mos

## 2016-09-07 ENCOUNTER — Ambulatory Visit (INDEPENDENT_AMBULATORY_CARE_PROVIDER_SITE_OTHER): Payer: 59 | Admitting: Family Medicine

## 2016-09-07 ENCOUNTER — Encounter: Payer: Self-pay | Admitting: Family Medicine

## 2016-09-07 VITALS — BP 122/76 | HR 72 | Ht 65.0 in | Wt 172.8 lb

## 2016-09-07 DIAGNOSIS — Z0000 Encounter for general adult medical examination without abnormal findings: Secondary | ICD-10-CM | POA: Diagnosis not present

## 2016-09-07 DIAGNOSIS — I10 Essential (primary) hypertension: Secondary | ICD-10-CM | POA: Diagnosis not present

## 2016-09-07 DIAGNOSIS — E663 Overweight: Secondary | ICD-10-CM | POA: Diagnosis not present

## 2016-09-07 DIAGNOSIS — E782 Mixed hyperlipidemia: Secondary | ICD-10-CM

## 2016-09-07 DIAGNOSIS — K219 Gastro-esophageal reflux disease without esophagitis: Secondary | ICD-10-CM | POA: Diagnosis not present

## 2016-09-07 DIAGNOSIS — E559 Vitamin D deficiency, unspecified: Secondary | ICD-10-CM | POA: Diagnosis not present

## 2016-09-07 DIAGNOSIS — R7301 Impaired fasting glucose: Secondary | ICD-10-CM | POA: Diagnosis not present

## 2016-09-07 LAB — POCT URINALYSIS DIPSTICK
Bilirubin, UA: NEGATIVE
Blood, UA: NEGATIVE
Glucose, UA: NEGATIVE
KETONES UA: NEGATIVE
Leukocytes, UA: NEGATIVE
NITRITE UA: NEGATIVE
PH UA: 6 (ref 5.0–8.0)
PROTEIN UA: NEGATIVE
Spec Grav, UA: 1.02 (ref 1.010–1.025)
Urobilinogen, UA: NEGATIVE E.U./dL — AB

## 2016-09-07 MED ORDER — LISINOPRIL 10 MG PO TABS: 10.0000 mg | ORAL_TABLET | Freq: Every day | ORAL | 3 refills | Status: DC

## 2016-09-07 NOTE — Patient Instructions (Signed)
  HEALTH MAINTENANCE RECOMMENDATIONS:  It is recommended that you get at least 30 minutes of aerobic exercise at least 5 days/week (for weight loss, you may need as much as 60-90 minutes). This can be any activity that gets your heart rate up. This can be divided in 10-15 minute intervals if needed, but try and build up your endurance at least once a week.  Weight bearing exercise is also recommended twice weekly.  Eat a healthy diet with lots of vegetables, fruits and fiber.  "Colorful" foods have a lot of vitamins (ie green vegetables, tomatoes, red peppers, etc).  Limit sweet tea, regular sodas and alcoholic beverages, all of which has a lot of calories and sugar.  Up to 1 alcoholic drink daily may be beneficial for women (unless trying to lose weight, watch sugars).  Drink a lot of water.  Calcium recommendations are 1200-1500 mg daily (1500 mg for postmenopausal women or women without ovaries), and vitamin D 1000 IU daily.  This should be obtained from diet and/or supplements (vitamins), and calcium should not be taken all at once, but in divided doses.  Monthly self breast exams and yearly mammograms for women over the age of 86 is recommended.  Sunscreen of at least SPF 30 should be used on all sun-exposed parts of the skin when outside between the hours of 10 am and 4 pm (not just when at beach or pool, but even with exercise, golf, tennis, and yard work!)  Use a sunscreen that says "broad spectrum" so it covers both UVA and UVB rays, and make sure to reapply every 1-2 hours.  Remember to change the batteries in your smoke detectors when changing your clock times in the spring and fall.  Use your seat belt every time you are in a car, and please drive safely and not be distracted with cell phones and texting while driving.  I recommend getting the new shingles vaccine (Shingrix). You will need to check with your insurance to see if it is covered.  It is a series of 2 injections, spaced 2  months apart.

## 2016-09-08 LAB — LIPID PANEL
Cholesterol: 296 mg/dL — ABNORMAL HIGH (ref <200)
HDL: 70 mg/dL (ref >50)
LDL Cholesterol: 177 mg/dL — ABNORMAL HIGH (ref <100)
Total CHOL/HDL Ratio: 4.2 Ratio (ref <5.0)
Triglycerides: 243 mg/dL — ABNORMAL HIGH (ref <150)
VLDL: 49 mg/dL — ABNORMAL HIGH (ref <30)

## 2016-09-08 LAB — VITAMIN D 25 HYDROXY (VIT D DEFICIENCY, FRACTURES): VIT D 25 HYDROXY: 20 ng/mL — AB (ref 30–100)

## 2016-09-08 MED ORDER — VITAMIN D (ERGOCALCIFEROL) 1.25 MG (50000 UNIT) PO CAPS: 50000.0000 [IU] | ORAL_CAPSULE | ORAL | 0 refills | Status: DC

## 2016-09-08 MED ORDER — ROSUVASTATIN CALCIUM 10 MG PO TABS: 10.0000 mg | ORAL_TABLET | Freq: Every day | ORAL | 2 refills | Status: DC

## 2016-09-09 ENCOUNTER — Other Ambulatory Visit: Payer: Self-pay | Admitting: *Deleted

## 2016-09-09 DIAGNOSIS — Z79899 Other long term (current) drug therapy: Secondary | ICD-10-CM

## 2016-09-09 DIAGNOSIS — E7800 Pure hypercholesterolemia, unspecified: Secondary | ICD-10-CM

## 2016-11-03 ENCOUNTER — Telehealth: Payer: Self-pay | Admitting: *Deleted

## 2016-11-03 ENCOUNTER — Ambulatory Visit (INDEPENDENT_AMBULATORY_CARE_PROVIDER_SITE_OTHER): Payer: 59 | Admitting: Family Medicine

## 2016-11-03 ENCOUNTER — Encounter: Payer: Self-pay | Admitting: Family Medicine

## 2016-11-03 VITALS — BP 140/80 | HR 88 | Temp 100.4°F | Ht 65.0 in | Wt 172.0 lb

## 2016-11-03 DIAGNOSIS — J029 Acute pharyngitis, unspecified: Secondary | ICD-10-CM

## 2016-11-03 DIAGNOSIS — E7800 Pure hypercholesterolemia, unspecified: Secondary | ICD-10-CM | POA: Diagnosis not present

## 2016-11-03 DIAGNOSIS — Z79899 Other long term (current) drug therapy: Secondary | ICD-10-CM

## 2016-11-03 DIAGNOSIS — B349 Viral infection, unspecified: Secondary | ICD-10-CM | POA: Diagnosis not present

## 2016-11-03 DIAGNOSIS — R509 Fever, unspecified: Secondary | ICD-10-CM

## 2016-11-03 LAB — LIPID PANEL
CHOLESTEROL: 190 mg/dL (ref <200)
HDL: 64 mg/dL (ref >50)
LDL CALC: 91 mg/dL (ref <100)
TRIGLYCERIDES: 175 mg/dL — AB (ref <150)
Total CHOL/HDL Ratio: 3 Ratio (ref <5.0)
VLDL: 35 mg/dL — ABNORMAL HIGH (ref <30)

## 2016-11-03 LAB — HEPATIC FUNCTION PANEL
ALT: 27 U/L (ref 6–29)
AST: 17 U/L (ref 10–35)
Albumin: 4.3 g/dL (ref 3.6–5.1)
Alkaline Phosphatase: 73 U/L (ref 33–130)
Bilirubin, Direct: 0.1 mg/dL (ref <=0.2)
Indirect Bilirubin: 0.4 mg/dL (ref 0.2–1.2)
TOTAL PROTEIN: 6.4 g/dL (ref 6.1–8.1)
Total Bilirubin: 0.5 mg/dL (ref 0.2–1.2)

## 2016-11-03 LAB — POCT RAPID STREP A (OFFICE): Rapid Strep A Screen: NEGATIVE

## 2016-11-03 MED ORDER — AZITHROMYCIN 250 MG PO TABS: ORAL_TABLET | ORAL | 0 refills | Status: DC

## 2016-11-03 NOTE — Progress Notes (Signed)
Chief Complaint  Patient presents with  . Sore Throat    started Sunday night-very sore throat, fever and "everything hurts.".  Marland Kitchen other    patient is scheduled for labs Monday morning, had about 1/2 of a diet coke and 1/2 of a diet ginger ale today (coming for lipids and liver) can she do these today instead?   2 nights ago she woke up with "raging fever" (didn't check that night), body aches, "everything hurt".  Yesterday she was fatigued, achey, low grade fevers (her thermometer only goes to 100).  Feels like her fever is higher at night.  Denies runny nose, ear pain.  She has some intermittent teeth pain (bottom, not top). She has swollen glands and sore throat, throat feels swollen.   No known sick contacts  Mother recently diagnosed with stage 3 ovarian cancer, choosing palliative care. She has been running around with her related to this, feels like her immune system is likely weakened due to this.  She is due for fasting labs, asking to have drawn today. This is for f/u hyperlipidemia.  She started Crestor the end of May.  She has noted some increased soreness, mild/tolerable.   Had a few sips of regular ginger ale, and diet coke, no food. Last drank 3 hours ago.  PMH, PSH, SH and FH reviewed and updated  Outpatient Encounter Prescriptions as of 11/03/2016  Medication Sig Note  . aspirin 81 MG tablet Take 81 mg by mouth daily.    Marland Kitchen esomeprazole (NEXIUM) 20 MG capsule Take 20 mg by mouth daily at 12 noon.    Marland Kitchen ibuprofen (ADVIL,MOTRIN) 200 MG tablet Take 800 mg by mouth every 6 (six) hours as needed. 11/03/2016: Last dose 3am  . lisinopril (PRINIVIL,ZESTRIL) 10 MG tablet Take 1 tablet (10 mg total) by mouth daily.   . Multiple Vitamins-Minerals (WOMENS 50+ MULTI VITAMIN/MIN) TABS Take 1 tablet by mouth daily.   . NON FORMULARY Take 1 capsule by mouth daily. 02/14/2015: Tumeric 1000mg   . rosuvastatin (CRESTOR) 10 MG tablet Take 1 tablet (10 mg total) by mouth daily.   . Vitamin D,  Ergocalciferol, (DRISDOL) 50000 units CAPS capsule Take 1 capsule (50,000 Units total) by mouth every 7 (seven) days.    No facility-administered encounter medications on file as of 11/03/2016.    Allergies  Allergen Reactions  . Codeine Other (See Comments)    Would make her start her period. Also just affected her body poorly so she just does not take it.  Marland Kitchen Hctz [Hydrochlorothiazide] Other (See Comments)    Hyponatremia   . Latex Rash  . Penicillins Rash   ROS:  +fever, chills, body aches, sore throat and fatigue per HPI. Denies ear pain, sinus pain, cough, shortness of breath, chest pain Denies nausea, vomiting, diarrhea, bleeding, bruising, rash.    PHYSICAL EXAM:  BP 140/80 (BP Location: Right Arm, Patient Position: Sitting, Cuff Size: Normal)   Pulse 88   Temp (!) 100.4 F (38 C) (Tympanic)   Ht 5\' 5"  (1.651 m)   Wt 172 lb (78 kg)   BMI 28.62 kg/m   Tired appearing female in no distress HEENT: conjunctiva and sclera are clear, sclera anicteric. Nasal mucosa--mild edema with clear mucus on the left Left TM has effusion--normal light reflex, but yellow in color. EAC's normal. OP--some streaks vertically of erythema of the posterior OP, bilaterally, with normal appearance centrally.  No other abnormality noted Sinuses nontender Neck: Tender anterior cervical LAD Heart: regular rate and rhythm Lungs:  clear bilaterally Back: no CVA tenderness Abdomen: soft, nontender, no mass Extremities: no edema Skin: normal turgor, no rash  Rapid strep negative  ASSESSMENT/PLAN:  Viral syndrome - discussed normal course, s/sx of bacterial infection and concerning symptoms. supportive measures reviewed  Sore throat - Plan: Rapid Strep A  Fever, unspecified fever cause - Plan: Rapid Strep A  Pure hypercholesterolemia - not entirely fasting.  This is f/u since Crestor started--prefers to have labs today rather than return, can return if needed (if TG high) - Plan: Lipid panel,  Hepatic Function Panel  Long-term use of high-risk medication - Plan: Lipid panel, Hepatic Function Panel   Understands risks of food affecting TG, prefers to have today 3 hours since had anything  Patient called back later in the day--see phone message. Asking for ABX--given z-pak given extenuating circumstances, she prefers not to wait another 3-5 days to see if symptoms resolve.    Drink plenty of fluids. Use tylenol or ibuprofen as needed for pain, fever, muscle aches. Consider salt water gargles or chloraseptic spray to also help with throat pain. I suspect some of the pain may come from postnasal drainage, so consider agents to dry up the nose to help with the throat (especially if increasing congestion develops), ie claritin.  Limit decongestants if blood pressure is running high (antihistamines such as claritin or coricidin HBP would be preferred). Return if worsening symptoms (sore throat,  significant cough, shortness of breath or other concerns). I suspect this is a viral syndrome, and wouldn't be surprised if you develop some worsening upper respiratory symptoms over the next couple of days (cough, nasal congestion).   Please try Coenzyme Q10 daily--this sometimes can help decrease muscle aches related to statin medications.  We will be in touch with your cholesterol results in 1-2 days, recognizing that your triglycerides may still be a touch up related to the sugar from the soda today.

## 2016-11-03 NOTE — Telephone Encounter (Signed)
Patient informed. 

## 2016-11-03 NOTE — Patient Instructions (Addendum)
Drink plenty of fluids. Use tylenol or ibuprofen as needed for pain, fever, muscle aches. Consider salt water gargles or chloraseptic spray to also help with throat pain. I suspect some of the pain may come from postnasal drainage, so consider agents to dry up the nose to help with the throat (especially if increasing congestion develops), ie claritin.  Limit decongestants if blood pressure is running high (antihistamines such as claritin or coricidin HBP would be preferred). Return if worsening symptoms (sore throat,  significant cough, shortness of breath or other concerns). I suspect this is a viral syndrome, and wouldn't be surprised if you develop some worsening upper respiratory symptoms over the next couple of days (cough, nasal congestion).   Please try Coenzyme Q10 daily--this sometimes can help decrease muscle aches related to statin medications.  We will be in touch with your cholesterol results in 1-2 days, recognizing that your triglycerides may still be a touch up related to the sugar from the soda today.

## 2016-11-03 NOTE — Telephone Encounter (Signed)
I really hate doing this, but I do understand her situation regarding her mother. I'm sending in z-pak for her.

## 2016-11-03 NOTE — Telephone Encounter (Signed)
Patient called and said that her throat is just really really hurting her. She truly thinks she has strep even though she tested negative. Said that she has appts at the end of the week to take her mother to and would really like to know if you can call her in an antibiotic. She would not ask but she can't remember the last time she took one or the last time she ever felt this badly.

## 2016-11-09 ENCOUNTER — Other Ambulatory Visit: Payer: Self-pay

## 2017-03-31 ENCOUNTER — Other Ambulatory Visit: Payer: Self-pay | Admitting: Family Medicine

## 2017-03-31 DIAGNOSIS — I10 Essential (primary) hypertension: Secondary | ICD-10-CM

## 2017-04-22 ENCOUNTER — Telehealth: Payer: 59 | Admitting: Family

## 2017-04-22 DIAGNOSIS — J111 Influenza due to unidentified influenza virus with other respiratory manifestations: Secondary | ICD-10-CM

## 2017-04-22 MED ORDER — OSELTAMIVIR PHOSPHATE 75 MG PO CAPS: 75.0000 mg | ORAL_CAPSULE | Freq: Two times a day (BID) | ORAL | 0 refills | Status: DC

## 2017-04-22 NOTE — Progress Notes (Signed)
Thank you for the details you included in the comment boxes. Those details are very helpful in determining the best course of treatment for you and help us to provide the best care.  E visit for Flu like symptoms   We are sorry that you are not feeling well.  Here is how we plan to help! Based on what you have shared with me it looks like you may have a respiratory virus that may be influenza.  Influenza or "the flu" is   an infection caused by a respiratory virus. The flu virus is highly contagious and persons who did not receive their yearly flu vaccination may "catch" the flu from close contact.  We have anti-viral medications to treat the viruses that cause this infection. They are not a "cure" and only shorten the course of the infection. These prescriptions are most effective when they are given within the first 2 days of "flu" symptoms. Antiviral medication are indicated if you have a high risk of complications from the flu. You should  also consider an antiviral medication if you are in close contact with someone who is at risk. These medications can help patients avoid complications from the flu  but have side effects that you should know. Possible side effects from Tamiflu or oseltamivir include nausea, vomiting, diarrhea, dizziness, headaches, eye redness, sleep problems or other respiratory symptoms. You should not take Tamiflu if you have an allergy to oseltamivir or any to the ingredients in Tamiflu.  Based upon your symptoms and potential risk factors I have prescribed Oseltamivir (Tamiflu).  It has been sent to your designated pharmacy.  You will take one 75 mg capsule orally twice a day for the next 5 days.  ANYONE WHO HAS FLU SYMPTOMS SHOULD: . Stay home. The flu is highly contagious and going out or to work exposes others! . Be sure to drink plenty of fluids. Water is fine as well as fruit juices, sodas and electrolyte beverages. You may want to stay away from caffeine or alcohol.  If you are nauseated, try taking small sips of liquids. How do you know if you are getting enough fluid? Your urine should be a pale yellow or almost colorless. . Get rest. . Taking a steamy shower or using a humidifier may help nasal congestion and ease sore throat pain. Using a saline nasal spray works much the same way. . Cough drops, hard candies and sore throat lozenges may ease your cough. . Line up a caregiver. Have someone check on you regularly.   GET HELP RIGHT AWAY IF: . You cannot keep down liquids or your medications. . You become short of breath . Your fell like you are going to pass out or loose consciousness. . Your symptoms persist after you have completed your treatment plan MAKE SURE YOU   Understand these instructions.  Will watch your condition.  Will get help right away if you are not doing well or get worse.  Your e-visit answers were reviewed by a board certified advanced clinical practitioner to complete your personal care plan.  Depending on the condition, your plan could have included both over the counter or prescription medications.  If there is a problem please reply  once you have received a response from your provider.  Your safety is important to us.  If you have drug allergies check your prescription carefully.    You can use MyChart to ask questions about today's visit, request a non-urgent call back, or ask for   ask for a work or school excuse for 24 hours related to this e-Visit. If it has been greater than 24 hours you will need to follow up with your provider, or enter a new e-Visit to address those concerns.  You will get an e-mail in the next two days asking about your experience.  I hope that your e-visit has been valuable and will speed your recovery. Thank you for using e-visits.

## 2017-04-22 NOTE — Addendum Note (Signed)
Addended by: Benjamine Mola on: 04/22/2017 10:12 AM   Modules accepted: Orders

## 2017-05-31 ENCOUNTER — Encounter: Payer: Self-pay | Admitting: Family Medicine

## 2017-05-31 DIAGNOSIS — E782 Mixed hyperlipidemia: Secondary | ICD-10-CM

## 2017-05-31 MED ORDER — ROSUVASTATIN CALCIUM 10 MG PO TABS: 10.0000 mg | ORAL_TABLET | Freq: Every day | ORAL | 3 refills | Status: DC
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
## 2017-08-30 ENCOUNTER — Ambulatory Visit (INDEPENDENT_AMBULATORY_CARE_PROVIDER_SITE_OTHER): Payer: 59 | Admitting: Family Medicine

## 2017-08-30 ENCOUNTER — Encounter: Payer: Self-pay | Admitting: Family Medicine

## 2017-08-30 VITALS — BP 142/80 | HR 84 | Ht 65.0 in | Wt 175.0 lb

## 2017-08-30 DIAGNOSIS — K219 Gastro-esophageal reflux disease without esophagitis: Secondary | ICD-10-CM | POA: Diagnosis not present

## 2017-08-30 DIAGNOSIS — Z5181 Encounter for therapeutic drug level monitoring: Secondary | ICD-10-CM | POA: Diagnosis not present

## 2017-08-30 DIAGNOSIS — E559 Vitamin D deficiency, unspecified: Secondary | ICD-10-CM | POA: Diagnosis not present

## 2017-08-30 DIAGNOSIS — R5383 Other fatigue: Secondary | ICD-10-CM | POA: Diagnosis not present

## 2017-08-30 DIAGNOSIS — E782 Mixed hyperlipidemia: Secondary | ICD-10-CM

## 2017-08-30 DIAGNOSIS — R7301 Impaired fasting glucose: Secondary | ICD-10-CM

## 2017-08-30 DIAGNOSIS — I10 Essential (primary) hypertension: Secondary | ICD-10-CM

## 2017-08-30 NOTE — Patient Instructions (Addendum)
Work on cutting back wine intake to 5-6 ounces/day or less. We discussed mindful eating and drinking (not while watching TV, drink water instead).  Restart the Crestor, but take it along with Coenzyme Q10 daily.  If you still develop muscle aches, try taking the Crestor just ever other day (but keep the CoQ10 daily). If you cannot tolerate the Crestor either way, let us know and we will change it to 20mg  atorvastatin (lipitor). Consider trying omega-3 fish oil up to 3000mg  daily, if tolerated.   Your blood pressure was a little elevated today.  Be sure to limit your sodium and monitor your blood pressure elsewhere. Please bring in your list of blood pressures when you come for your labs.  If persistently >135-140/85-90 we will need to make adjustments. (goal is <130/80).   Low-Sodium Eating Plan Sodium, which is an element that makes up salt, helps you maintain a healthy balance of fluids in your body. Too much sodium can increase your blood pressure and cause fluid and waste to be held in your body. Your health care provider or dietitian may recommend following this plan if you have high blood pressure (hypertension), kidney disease, liver disease, or heart failure. Eating less sodium can help lower your blood pressure, reduce swelling, and protect your heart, liver, and kidneys. What are tips for following this plan? General guidelines  Most people on this plan should limit their sodium intake to 1,500-2,000 mg (milligrams) of sodium each day. Reading food labels  The Nutrition Facts label lists the amount of sodium in one serving of the food. If you eat more than one serving, you must multiply the listed amount of sodium by the number of servings.  Choose foods with less than 140 mg of sodium per serving.  Avoid foods with 300 mg of sodium or more per serving. Shopping  Look for lower-sodium products, often labeled as "low-sodium" or "no salt added."  Always check the sodium  content even if foods are labeled as "unsalted" or "no salt added".  Buy fresh foods. ? Avoid canned foods and premade or frozen meals. ? Avoid canned, cured, or processed meats  Buy breads that have less than 80 mg of sodium per slice. Cooking  Eat more home-cooked food and less restaurant, buffet, and fast food.  Avoid adding salt when cooking. Use salt-free seasonings or herbs instead of table salt or sea salt. Check with your health care provider or pharmacist before using salt substitutes.  Cook with plant-based oils, such as canola, sunflower, or olive oil. Meal planning  When eating at a restaurant, ask that your food be prepared with less salt or no salt, if possible.  Avoid foods that contain MSG (monosodium glutamate). MSG is sometimes added to [place] food, bouillon, and some canned foods. What foods are recommended? The items listed may not be a complete list. Talk with your dietitian about what dietary choices are best for you. Grains Low-sodium cereals, including oats, puffed wheat and rice, and shredded wheat. Low-sodium crackers. Unsalted rice. Unsalted pasta. Low-sodium bread. Whole-grain breads and whole-grain pasta. Vegetables Fresh or frozen vegetables. "No salt added" canned vegetables. "No salt added" tomato sauce and paste. Low-sodium or reduced-sodium tomato and vegetable juice. Fruits Fresh, frozen, or canned fruit. Fruit juice. Meats and other protein foods Fresh or frozen (no salt added) meat, poultry, seafood, and fish. Low-sodium canned tuna and salmon. Unsalted nuts. Dried peas, beans, and lentils without added salt. Unsalted canned beans. Eggs. Unsalted nut butters. Dairy Milk. Soy  milk. Cheese that is naturally low in sodium, such as ricotta cheese, fresh mozzarella, or Swiss cheese Low-sodium or reduced-sodium cheese. Cream cheese. Yogurt. Fats and oils Unsalted butter. Unsalted margarine with no trans fat. Vegetable oils such as canola or olive  oils. Seasonings and other foods Fresh and dried herbs and spices. Salt-free seasonings. Low-sodium mustard and ketchup. Sodium-free salad dressing. Sodium-free light mayonnaise. Fresh or refrigerated horseradish. Lemon juice. Vinegar. Homemade, reduced-sodium, or low-sodium soups. Unsalted popcorn and pretzels. Low-salt or salt-free chips. What foods are not recommended? The items listed may not be a complete list. Talk with your dietitian about what dietary choices are best for you. Grains Instant hot cereals. Bread stuffing, pancake, and biscuit mixes. Croutons. Seasoned rice or pasta mixes. Noodle soup cups. Boxed or frozen macaroni and cheese. Regular salted crackers. Self-rising flour. Vegetables Sauerkraut, pickled vegetables, and relishes. Olives. [place] fries. Onion rings. Regular canned vegetables (not low-sodium or reduced-sodium). Regular canned tomato sauce and paste (not low-sodium or reduced-sodium). Regular tomato and vegetable juice (not low-sodium or reduced-sodium). Frozen vegetables in sauces. Meats and other protein foods Meat or fish that is salted, canned, smoked, spiced, or pickled. Bacon, ham, sausage, hotdogs, corned beef, chipped beef, packaged lunch meats, salt pork, jerky, pickled herring, anchovies, regular canned tuna, sardines, salted nuts. Dairy Processed cheese and cheese spreads. Cheese curds. Blue cheese. Feta cheese. String cheese. Regular cottage cheese. Buttermilk. Canned milk. Fats and oils Salted butter. Regular margarine. Ghee. Bacon fat. Seasonings and other foods Onion salt, garlic salt, seasoned salt, table salt, and sea salt. Canned and packaged gravies. Worcestershire sauce. Tartar sauce. Barbecue sauce. Teriyaki sauce. Soy sauce, including reduced-sodium. Steak sauce. Fish sauce. Oyster sauce. Cocktail sauce. Horseradish that you find on the shelf. Regular ketchup and mustard. Meat flavorings and tenderizers. Bouillon cubes. Hot sauce and Tabasco sauce.  Premade or packaged marinades. Premade or packaged taco seasonings. Relishes. Regular salad dressings. Salsa. Potato and tortilla chips. Corn chips and puffs. Salted popcorn and pretzels. Canned or dried soups. Pizza. Frozen entrees and pot pies. Summary  Eating less sodium can help lower your blood pressure, reduce swelling, and protect your heart, liver, and kidneys.  Most people on this plan should limit their sodium intake to 1,500-2,000 mg (milligrams) of sodium each day.  Canned, boxed, and frozen foods are high in sodium. Restaurant foods, fast foods, and pizza are also very high in sodium. You also get sodium by adding salt to food.  Try to cook at home, eat more fresh fruits and vegetables, and eat less fast food, canned, processed, or prepared foods. This information is not intended to replace advice given to you by your health care provider. Make sure you discuss any questions you have with your health care provider. Document Released: 09/26/2001 Document Revised: 03/30/2016 Document Reviewed: 03/30/2016 Elsevier Interactive Patient Education  [employer].

## 2017-09-12 ENCOUNTER — Other Ambulatory Visit: Payer: Self-pay | Admitting: Family Medicine

## 2017-09-12 DIAGNOSIS — E782 Mixed hyperlipidemia: Secondary | ICD-10-CM

## 2017-09-16 DIAGNOSIS — Z6829 Body mass index (BMI) 29.0-29.9, adult: Secondary | ICD-10-CM | POA: Diagnosis not present

## 2017-09-16 DIAGNOSIS — Z01419 Encounter for gynecological examination (general) (routine) without abnormal findings: Secondary | ICD-10-CM | POA: Diagnosis not present

## 2017-09-16 DIAGNOSIS — Z808 Family history of malignant neoplasm of other organs or systems: Secondary | ICD-10-CM | POA: Diagnosis not present

## 2017-09-16 DIAGNOSIS — Z13820 Encounter for screening for osteoporosis: Secondary | ICD-10-CM | POA: Diagnosis not present

## 2017-09-16 DIAGNOSIS — Z8041 Family history of malignant neoplasm of ovary: Secondary | ICD-10-CM | POA: Diagnosis not present

## 2017-09-16 DIAGNOSIS — Z1231 Encounter for screening mammogram for malignant neoplasm of breast: Secondary | ICD-10-CM | POA: Diagnosis not present

## 2017-09-16 DIAGNOSIS — Z803 Family history of malignant neoplasm of breast: Secondary | ICD-10-CM | POA: Diagnosis not present

## 2017-09-16 LAB — HM DEXA SCAN

## 2017-09-16 LAB — HM PAP SMEAR: HM Pap smear: NEGATIVE

## 2017-09-21 ENCOUNTER — Other Ambulatory Visit: Payer: Self-pay | Admitting: Family Medicine

## 2017-09-21 DIAGNOSIS — I10 Essential (primary) hypertension: Secondary | ICD-10-CM

## 2017-09-21 LAB — HM MAMMOGRAPHY

## 2017-09-21 NOTE — Telephone Encounter (Signed)
Pt has an appt in September.

## 2017-10-05 ENCOUNTER — Encounter: Payer: Self-pay | Admitting: Family Medicine

## 2017-10-07 ENCOUNTER — Other Ambulatory Visit: Payer: Self-pay | Admitting: Family Medicine

## 2017-10-07 DIAGNOSIS — E782 Mixed hyperlipidemia: Secondary | ICD-10-CM

## 2017-11-30 ENCOUNTER — Other Ambulatory Visit: Payer: Self-pay | Admitting: Family Medicine

## 2017-11-30 DIAGNOSIS — I10 Essential (primary) hypertension: Secondary | ICD-10-CM

## 2017-12-09 DIAGNOSIS — M1712 Unilateral primary osteoarthritis, left knee: Secondary | ICD-10-CM | POA: Diagnosis not present

## 2017-12-09 DIAGNOSIS — M25522 Pain in left elbow: Secondary | ICD-10-CM | POA: Diagnosis not present

## 2017-12-17 ENCOUNTER — Other Ambulatory Visit: Payer: Self-pay | Admitting: Family Medicine

## 2017-12-17 DIAGNOSIS — E782 Mixed hyperlipidemia: Secondary | ICD-10-CM

## 2018-01-13 ENCOUNTER — Encounter: Payer: Self-pay | Admitting: Family Medicine

## 2018-01-16 ENCOUNTER — Other Ambulatory Visit: Payer: Self-pay | Admitting: Family Medicine

## 2018-01-16 DIAGNOSIS — E782 Mixed hyperlipidemia: Secondary | ICD-10-CM

## 2018-01-17 NOTE — Telephone Encounter (Signed)
She was last seen here in May 2019, wasn't fasting, and was supposed to return in 6-8 weeks for fasting labs.  She never came for those labs, and recently canceled her physical. This med has been refilled many times; hasn't had labs since 10/2016.  She needs to schedule med check within the next couple of weeks.  Prior to refilling, need to speak with pt and explain this, and only refill #30, no refills, with NO FURTHER REFILLS if she doesn't come for labs. I'd prefer labs to be done prior to her visit, so that we have her results to discuss at that visit, but if that is too difficult, she must come to the visit FASTING (which is what she did NOT do at her last visit here in May).  There are future orders in the system from May.

## 2018-01-17 NOTE — Telephone Encounter (Signed)
Is this ok to refill?  Patient cancelled her last visit on 01-13-18.

## 2018-01-18 NOTE — Telephone Encounter (Signed)
Left message for patient to please call back. 

## 2018-01-19 NOTE — Telephone Encounter (Signed)
Patient called and is coming in 02/21/18 for labs and OV with Dr. Tomi Bamberger 02/24/18

## 2018-02-09 ENCOUNTER — Other Ambulatory Visit: Payer: Self-pay | Admitting: Family Medicine

## 2018-02-09 DIAGNOSIS — I10 Essential (primary) hypertension: Secondary | ICD-10-CM

## 2018-02-12 ENCOUNTER — Other Ambulatory Visit: Payer: Self-pay | Admitting: Family Medicine

## 2018-02-12 DIAGNOSIS — E782 Mixed hyperlipidemia: Secondary | ICD-10-CM

## 2018-02-21 ENCOUNTER — Other Ambulatory Visit: Payer: Self-pay

## 2018-02-23 NOTE — Progress Notes (Signed)
Chief Complaint  Patient presents with  . Hypertension    med check, patient is non-fasting. Will come back for labs. Would like to know if she can have chest CT since she stopped smoking 04/09/17.    Patient presents for follow-up on cholesterol, hypertension and vitamin D deficiency.  She was last seen in May, at which time she declined labs, was to return in 2 weeks and never did.  She missed her nurse visit earlier this week, when she was supposed to have labs done in order to review at today's visit.  She now hasn't had labs since 10/2016. She cancelled her physical in September, now rescheduled for April. She was called this morning to remind her to be fasting, but her boyfriend brought her a coffee drink and she drank it.  She is not fasting today.   Hyperlipidemia:  She remains on Crestor.  She had stopped it back in February due to some pain complaints, but restarted it in March, along with Coenzyme Q10. She is tolerating this well without problems, while taking along with coenzyme Q10.  She continues to follow a mostly mediterranean diet, tries to keep it low cholesterol, lowfat. She does eat some cheese, tries to limit. Drinks 1-2 glasses of wine/night.  Last check was in July, 2018, which was recheck after Crestor was started. Lab Results  Component Value Date   CHOL 190 11/03/2016   HDL 64 11/03/2016   LDLCALC 91 11/03/2016   TRIG 175 (H) 11/03/2016   CHOLHDL 3.0 11/03/2016   Around the time she stopped the Crestor, she was also having frequent, loose stools.  She thought it could be from her medications, so she stopped all medications except the lisinopril and Nexium, and her bowels went back to normal. She was also taking a lot of supplements (olive leaf pill, turmeric, MVI, B12, D, aspirin). She no longer takes aspirin, olive leaf and turmeric, and bowels are fine.  Vitamin D deficiency: Last level was 20 in 08/2016; prior to that was 11 in November, 2017. She was treated  with 12 week courses each time. She is taking 2000 IU of gummy vitamin D3 every day.  She is also taking gummy MVI.  Hypertension. She hasn't been checking BP elsewhere, just sporadically, and recalls it being 135-40/75-85 range. She has some palpitations every once ina while, during the day, related to caffeine.  Doesn't happen in the evenings or with exercise. She is walking briskly 30-45 minutes five days/week. Plans to start weight-bearing exercise.  GERD: She has been takingNexium OTCwith good results,needs to take it daily. She has recurrent symptoms with missed doses.(previously took rx doses, no longer needed). Avoids carbs, bagels, as it triggers reflux, dysphagia. No longer having issues with dysphagia  Impaired fasting glucose: H/o GDM. Tries to limit carbs/sweets/sugar. A1c's and fasting sugars have been normal, last elevated sugar was 2014. Lab Results  Component Value Date   HGBA1C 5.5 02/27/2016   She cancelled her physical this past September, now scheduled for April. She is due for vaccines--TdaP, discussion of Shingrix, flu shot. Colonoscopy is due These were reviewed with her today.  PMH, PSH, SH reviewed  Outpatient Encounter Medications as of 02/24/2018  Medication Sig Note  . Cholecalciferol (VITAMIN D) 2000 units CAPS Take 1 capsule by mouth. 02/24/2018: daily  . Coenzyme Q10 (COQ10 PO) Take 1 capsule by mouth daily.   Marland Kitchen esomeprazole (NEXIUM) 20 MG capsule Take 20 mg by mouth daily at 12 noon.    Marland Kitchen  lisinopril (PRINIVIL,ZESTRIL) 10 MG tablet Take 1 tablet (10 mg total) by mouth daily.   . Multiple Vitamins-Minerals (WOMENS 50+ MULTI VITAMIN/MIN) TABS Take 1 tablet by mouth daily.   . rosuvastatin (CRESTOR) 10 MG tablet TAKE 1 TABLET(10 MG) BY MOUTH DAILY   . [DISCONTINUED] NON FORMULARY Take 1 capsule by mouth daily. 02/14/2015: Tumeric 1000mg   . aspirin 81 MG tablet Take 81 mg by mouth daily.    Marland Kitchen ibuprofen (ADVIL,MOTRIN) 200 MG tablet Take 800 mg by  mouth every 6 (six) hours as needed. 11/03/2016: Last dose 3am  . [DISCONTINUED] lisinopril (PRINIVIL,ZESTRIL) 10 MG tablet TAKE 1 TABLET(10 MG) BY MOUTH DAILY    No facility-administered encounter medications on file as of 02/24/2018.    Allergies  Allergen Reactions  . Codeine Other (See Comments)    Would make her start her period. Also just affected her body poorly so she just does not take it.  Marland Kitchen Hctz [Hydrochlorothiazide] Other (See Comments)    Hyponatremia   . Latex Rash  . Penicillins Rash    ROS: no fever, chills, headaches, dizziness, chest pain, palpitations (rare, related to too much caffeine).  No cough, shortness of breath.  Denies myalgias.  Moods are good.  No URI symptoms, but does reports some head congestion. No bleeding, bruising, rash.  Bowels are normal   PHYSICAL EXAM:  BP 140/86   Pulse 80   Ht 5\' 5"  (1.651 m)   Wt 179 lb 12.8 oz (81.6 kg)   BMI 29.92 kg/m   Wt Readings from Last 3 Encounters:  02/24/18 179 lb 12.8 oz (81.6 kg)  08/30/17 175 lb (79.4 kg)  11/03/16 172 lb (78 kg)   Well-appearing, pleasant female, in good spirits.  HEENT: conjunctiva and sclera are clear, EOMI, OP clear. Nasal mucosa is moderately edematous. No purulence or sinus tenderness. Neck: no lymphadenopathy, thyromegaly or mass, no carotid bruit Heart: regular rate and rhythm Lungs: clear bilaterally Back: no spinal or CVA tenderness Abdomen: soft, nontender, no organomegaly or mass Extremities: no edema, normal pulses Psych: normal mood, affect, hygiene and grooming Neuro: alert and oriented, cranial nerve intact, normal gait   ASSESSMENT/PLAN:  Mixed hyperlipidemia - given lab slip to go to [place] on a Saturday (easier for pt); continue Crestor and low chol diet  Impaired fasting glucose - Plan: Comprehensive metabolic panel, Hemoglobin A1c  Gastroesophageal reflux disease, esophagitis presence not specified - controlled with OTC med; encouraged to decrease wine to  1/day; wt loss rec  Need for influenza vaccination - Plan: Flu Vaccine QUAD 6+ mos PF IM (Fluarix Quad PF)  Other fatigue - Plan: TSH, Comprehensive metabolic panel, CBC with Differential/Platelet  Essential hypertension, benign - elevated today. Cont to monitor elsewhere; low Na diet; cont regular exercise. Some weight loss rec. Cont current meds - Plan: Comprehensive metabolic panel  Medication monitoring encounter - Plan: Comprehensive metabolic panel, CBC with Differential/Platelet, VITAMIN D 25 Hydroxy (Vit-D Deficiency, Fractures), Lipid panel  Vitamin D deficiency - due for recheck with labs. Continue supplement. - Plan: VITAMIN D 25 Hydroxy (Vit-D Deficiency, Fractures)  Allergic rhinitis, unspecified seasonality, unspecified trigger - discussed antihistamines and nasal steroids.  Rec Flonase  Need for Tdap vaccination - declined today. Rec she return for NV, vs will give at her CPE (and will be more past due)  Discussed Shingrix in detail, to check with insurance prior to her CPE, can return for NV sooner, if desired Colonoscopy due--advised to contact GI  Pt reports being UTD on mammo  and GYN visits, got mammo at Dr. Julien Girt office, will call for results.   F/u as scheduled for CPE, sooner prn.

## 2018-02-24 ENCOUNTER — Encounter: Payer: Self-pay | Admitting: Family Medicine

## 2018-02-24 ENCOUNTER — Ambulatory Visit (INDEPENDENT_AMBULATORY_CARE_PROVIDER_SITE_OTHER): Payer: 59 | Admitting: Family Medicine

## 2018-02-24 VITALS — BP 140/86 | HR 80 | Ht 65.0 in | Wt 179.8 lb

## 2018-02-24 DIAGNOSIS — R7301 Impaired fasting glucose: Secondary | ICD-10-CM

## 2018-02-24 DIAGNOSIS — K219 Gastro-esophageal reflux disease without esophagitis: Secondary | ICD-10-CM | POA: Diagnosis not present

## 2018-02-24 DIAGNOSIS — I10 Essential (primary) hypertension: Secondary | ICD-10-CM

## 2018-02-24 DIAGNOSIS — J309 Allergic rhinitis, unspecified: Secondary | ICD-10-CM

## 2018-02-24 DIAGNOSIS — R5383 Other fatigue: Secondary | ICD-10-CM

## 2018-02-24 DIAGNOSIS — E782 Mixed hyperlipidemia: Secondary | ICD-10-CM | POA: Diagnosis not present

## 2018-02-24 DIAGNOSIS — Z23 Encounter for immunization: Secondary | ICD-10-CM

## 2018-02-24 DIAGNOSIS — E559 Vitamin D deficiency, unspecified: Secondary | ICD-10-CM

## 2018-02-24 DIAGNOSIS — Z5181 Encounter for therapeutic drug level monitoring: Secondary | ICD-10-CM

## 2018-02-24 NOTE — Patient Instructions (Addendum)
Contact GI to schedule your colonoscopy. Consider nurse visit for tetanus (TdaP) vs waiting until your physical.  I recommend getting the new shingles vaccine (Shingrix). You will need to check with your insurance to see if it is covered, and if covered, schedule a nurse visit.  It is a series of 2 injections, spaced 2 months apart.  Go to [facility] (see handout) for your FASTING lab draw for cholesterol, on a Saturday morning.  Try taking allegra or zyrtec once daily for allergies. You might also benefit from taking a nasal steroid spray daily such as Flonase.

## 2018-02-25 ENCOUNTER — Encounter: Payer: Self-pay | Admitting: Family Medicine

## 2018-02-25 LAB — COMPREHENSIVE METABOLIC PANEL
A/G RATIO: 2.5 — AB (ref 1.2–2.2)
ALBUMIN: 5.2 g/dL — AB (ref 3.6–4.8)
ALT: 27 IU/L (ref 0–32)
AST: 21 IU/L (ref 0–40)
Alkaline Phosphatase: 68 IU/L (ref 39–117)
BILIRUBIN TOTAL: 0.4 mg/dL (ref 0.0–1.2)
BUN / CREAT RATIO: 25 (ref 12–28)
BUN: 18 mg/dL (ref 8–27)
CHLORIDE: 102 mmol/L (ref 96–106)
CO2: 24 mmol/L (ref 20–29)
Calcium: 10.4 mg/dL — ABNORMAL HIGH (ref 8.7–10.3)
Creatinine, Ser: 0.72 mg/dL (ref 0.57–1.00)
GFR calc non Af Amer: 91 mL/min/{1.73_m2} (ref >59)
GFR, EST AFRICAN AMERICAN: 105 mL/min/{1.73_m2} (ref >59)
Globulin, Total: 2.1 g/dL (ref 1.5–4.5)
Glucose: 85 mg/dL (ref 65–99)
POTASSIUM: 5.7 mmol/L — AB (ref 3.5–5.2)
SODIUM: 142 mmol/L (ref 134–144)
TOTAL PROTEIN: 7.3 g/dL (ref 6.0–8.5)

## 2018-02-25 LAB — CBC WITH DIFFERENTIAL/PLATELET
BASOS ABS: 0 10*3/uL (ref 0.0–0.2)
Basos: 0 %
EOS (ABSOLUTE): 0.2 10*3/uL (ref 0.0–0.4)
Eos: 2 %
HEMOGLOBIN: 14.1 g/dL (ref 11.1–15.9)
Hematocrit: 41.2 % (ref 34.0–46.6)
Immature Grans (Abs): 0 10*3/uL (ref 0.0–0.1)
Immature Granulocytes: 0 %
LYMPHS ABS: 2 10*3/uL (ref 0.7–3.1)
Lymphs: 27 %
MCH: 32.1 pg (ref 26.6–33.0)
MCHC: 34.2 g/dL (ref 31.5–35.7)
MCV: 94 fL (ref 79–97)
Monocytes Absolute: 0.5 10*3/uL (ref 0.1–0.9)
Monocytes: 7 %
NEUTROS ABS: 4.7 10*3/uL (ref 1.4–7.0)
Neutrophils: 64 %
Platelets: 304 10*3/uL (ref 150–450)
RBC: 4.39 x10E6/uL (ref 3.77–5.28)
RDW: 12.2 % — AB (ref 12.3–15.4)
WBC: 7.4 10*3/uL (ref 3.4–10.8)

## 2018-02-25 LAB — HEMOGLOBIN A1C
ESTIMATED AVERAGE GLUCOSE: 114 mg/dL
Hgb A1c MFr Bld: 5.6 % (ref 4.8–5.6)

## 2018-02-25 LAB — VITAMIN D 25 HYDROXY (VIT D DEFICIENCY, FRACTURES): Vit D, 25-Hydroxy: 34.6 ng/mL (ref 30.0–100.0)

## 2018-02-25 LAB — TSH: TSH: 0.931 u[IU]/mL (ref 0.450–4.500)

## 2018-03-09 ENCOUNTER — Other Ambulatory Visit: Payer: Self-pay | Admitting: Family Medicine

## 2018-03-09 DIAGNOSIS — E782 Mixed hyperlipidemia: Secondary | ICD-10-CM

## 2018-04-01 ENCOUNTER — Telehealth: Payer: Self-pay | Admitting: Family Medicine

## 2018-04-01 NOTE — Telephone Encounter (Signed)
Received requested records from [facility]. Sending back for review.

## 2018-04-14 ENCOUNTER — Encounter: Payer: Self-pay | Admitting: *Deleted

## 2018-04-29 DIAGNOSIS — M5412 Radiculopathy, cervical region: Secondary | ICD-10-CM | POA: Diagnosis not present

## 2018-07-18 ENCOUNTER — Other Ambulatory Visit: Payer: Self-pay | Admitting: Family Medicine

## 2018-07-18 DIAGNOSIS — Z5181 Encounter for therapeutic drug level monitoring: Secondary | ICD-10-CM

## 2018-07-19 ENCOUNTER — Other Ambulatory Visit: Payer: Self-pay

## 2018-07-21 ENCOUNTER — Other Ambulatory Visit: Payer: Self-pay | Admitting: *Deleted

## 2018-07-21 ENCOUNTER — Encounter: Payer: Self-pay | Admitting: Family Medicine

## 2018-07-21 DIAGNOSIS — E782 Mixed hyperlipidemia: Secondary | ICD-10-CM

## 2018-07-26 ENCOUNTER — Other Ambulatory Visit: Payer: Self-pay

## 2018-07-27 ENCOUNTER — Encounter: Payer: Self-pay | Admitting: Family Medicine

## 2018-07-28 ENCOUNTER — Encounter: Payer: Self-pay | Admitting: Family Medicine

## 2018-10-31 ENCOUNTER — Encounter: Payer: Self-pay | Admitting: Family Medicine

## 2018-10-31 ENCOUNTER — Other Ambulatory Visit: Payer: Self-pay | Admitting: *Deleted

## 2018-10-31 DIAGNOSIS — E782 Mixed hyperlipidemia: Secondary | ICD-10-CM

## 2018-10-31 DIAGNOSIS — M791 Myalgia, unspecified site: Secondary | ICD-10-CM

## 2018-10-31 DIAGNOSIS — I10 Essential (primary) hypertension: Secondary | ICD-10-CM

## 2018-10-31 DIAGNOSIS — Z5181 Encounter for therapeutic drug level monitoring: Secondary | ICD-10-CM

## 2018-10-31 MED ORDER — LISINOPRIL 10 MG PO TABS: 10.0000 mg | ORAL_TABLET | Freq: Every day | ORAL | 0 refills | Status: DC

## 2018-10-31 MED ORDER — ROSUVASTATIN CALCIUM 10 MG PO TABS: ORAL_TABLET | ORAL | 0 refills | Status: DC

## 2018-11-06 NOTE — Progress Notes (Signed)
Start time: 12:06 End time: 12:48  Virtual Visit via Video Note  I connected with Laura Payne on 11/09/2018 by a video enabled telemedicine application and verified that I am speaking with the correct person using two identifiers.  Location: Patient: at work (only 1 other employee present, she is comfortable talking) Provider: office   I discussed the limitations of evaluation and management by telemedicine and the availability of in person appointments. The patient expressed understanding and agreed to proceed. She consents to have her insurance filed for this visit.  History of Present Illness:  Chief Complaint  Patient presents with  . Hypertension    VIRTUAL med check. Shoulder pain, R shoulder down to her arm and legs. Really stiff after sitting or sleeping.    Someone from [facility] contacted her and said they were sending a kit for stool testing, to be able to postpone colonoscopy for a year. She hasn't received this and it was at least 4-6 weeks ago.  Patient presents for follow-up on cholesterol, and hypertension.  She was last seen in 02/2018, but wasn't fasting at that time, so lipids not done.  Last done 10/2016.  She did have labs done prior to today's visit, see below. She requested to have labs added for evaluation of polymyalgia rheumatica, as she feels her symptoms are similar to her friend with this diagnosis.  See labs below.  Hyperlipidemia:  She had been taking Crestor, at last visit reported tolerating this well without problems, while taking along with coenzyme Q10.  She ran out of Crestor in July, missed only about a week, otherwise reports compliance. She has had dietary changes.  Early in pandemic she was eating deli meat (mostly [place], some ham). When her shoulder started bothering her, thought inflammatory component, she started eating tuna (with mayo) or tomato sandwiches. She tries to avoid gluten.  She does report eating more red meat, eating more  with her new boyfriend and he prefers red meat. He is diabetic.  Admits that her wine intake has increased to 3-4 glasses most nights (had been 1-2). Doesn't eat sweets.  Lab Results  Component Value Date   CHOL 190 11/03/2016   HDL 64 11/03/2016   LDLCALC 91 11/03/2016   TRIG 175 (H) 11/03/2016   CHOLHDL 3.0 11/03/2016   Vitamin D deficiency:Last level was 34.6 in 02/2018, when taking 2000 IU of gummy vitamin D3 daily (along with gummy MVI).  Previously had been low, with level of 20 in 08/2016; prior to that was11 in November, 2017.   She continues to take 2000 IU daily.  Hypertension. She reports compliance with lisinopril 98m daily.  BP's have been running upper 120's to low 130's/80-90. Denies dizziness. She has had some more headaches lately.  She has been having shoulder pain, which affect her sleep. She has some palpitations every once in a while, during the day, related to caffeine.  Doesn't happen in the evenings or with exercise. She is walking briskly 30 minutes five days/week.  Hasn't been doing weight-bearing exercise, due to shoulder pain (since January.)  Impaired fasting glucose: H/o GDM. Tries to limit carbs/sweets/sugar. A1c's and fasting sugars have been normal, last elevated sugar was 2014. Admits to increased wine intake as reported above.  Lab Results  Component Value Date   HGBA1C 5.6 02/24/2018   Right shoulder pain since January.  She [place]including light fixtures[place] had x-ray, wanted her to have MRI, but she got better. Pain gets better and worse, but getting worse, and the better isn't as good as it had been. She uses advil prn, 667m, last time didn't help much, it usually gives some help. She is icing it, which helps some. Pain starts at her neck and radiates down to her upper arm, and sometimes down to the hand. Rare numbness/tingling. No weakness in  upper extremity.  PMH, PSH, SH reviewed  Outpatient Encounter Medications as of 11/09/2018  Medication Sig Note  . aspirin 81 MG tablet Take 81 mg by mouth daily.    . Cholecalciferol (VITAMIN D) 2000 units CAPS Take 1 capsule by mouth. 02/24/2018: daily  . Coenzyme Q10 (COQ10 PO) Take 1 capsule by mouth daily.   .Marland Kitchenesomeprazole (NEXIUM) 20 MG capsule Take 20 mg by mouth daily at 12 noon.    .Marland Kitchenlisinopril (ZESTRIL) 10 MG tablet Take 1 tablet (10 mg total) by mouth daily.   . Multiple Vitamins-Minerals (WOMENS 50+ MULTI VITAMIN/MIN) TABS Take 1 tablet by mouth daily.   . rosuvastatin (CRESTOR) 10 MG tablet TAKE 1 TABLET(10 MG) BY MOUTH DAILY   . TURMERIC PO Take 2 tablets by mouth daily.   . [DISCONTINUED] rosuvastatin (CRESTOR) 10 MG tablet TAKE 1 TABLET(10 MG) BY MOUTH DAILY   . ibuprofen (ADVIL,MOTRIN) 200 MG tablet Take 800 mg by mouth every 6 (six) hours as needed.   . meloxicam (MOBIC) 15 MG tablet Take 1 tablet (15 mg total) by mouth daily.    No facility-administered encounter medications on file as of 11/09/2018.    Allergies  Allergen Reactions  . Codeine Other (See Comments)    Would make her start her period. Also just affected her body poorly so she just does not take it.  .Marland KitchenHctz [Hydrochlorothiazide] Other (See Comments)    Hyponatremia   . Latex Rash  . Penicillins Rash    ROS:  No fever, chills, URI symptoms.  Some headaches and shoulder pain per HPI.  Reflux is controlled.  No dizziness, chest pain, shortness of breath.   See HPI.    Observations/Objective:  BP (!) 138/92   Pulse 89   Ht '5\' 5"'  (1.651 m)   Wt 174 lb (78.9 kg)   BMI 28.96 kg/m   Wt Readings from Last 3 Encounters:  02/24/18 179 lb 12.8 oz (81.6 kg)  08/30/17 175 lb (79.4 kg)  11/03/16 172 lb (78 kg)    Well-appearing, pleasant female in no distress. She is alert, oriented, with grossly intact cranial nerves She has pain with ROM of shoulder, full range observed  Lab Results  Component  Value Date   CHOL 264 (H) 11/08/2018   HDL 76 11/08/2018   LDLCALC 147 (H) 11/08/2018   TRIG 204 (H) 11/08/2018   CHOLHDL 3.5 11/08/2018     Chemistry      Component Value Date/Time   NA 142 11/08/2018 0930   K 4.8 11/08/2018 0930   CL 102 11/08/2018 0930   CO2 23 11/08/2018 0930   BUN 13 11/08/2018 0930   CREATININE 0.82 11/08/2018 0930   CREATININE 0.89 02/27/2016 1524      Component Value Date/Time   CALCIUM 9.9 11/08/2018 0930   ALKPHOS 68 11/08/2018 0930   AST 22 11/08/2018 0930   ALT 34 (H) 11/08/2018 0930   BILITOT 0.3 11/08/2018 0930     Fasting glucose 117   Assessment and Plan:  Mixed hyperlipidemia - elevated--out of  med for a week, but also change in diet (more red meat and wine). Recheck in 3 mos - Plan: rosuvastatin (CRESTOR) 10 MG tablet, Hepatic function panel  Essential hypertension, benign - above goal, borderline/high at home.  Cut back on sodium, wine, weight loss. Keep record and bring list to f/u in 3 mos (and bring monitor to verify accuracy)  Screening for colon cancer - prefers Cologuard testing over yearly hemoccult or colonoscopy - Plan: Cologuard  Right shoulder pain, unspecified chronicity - rec up to 2 weeks of mobic daily, risks/SE reviewed. f/u with ortho if not better. Shown ROM exercises. unclear if could have cervical radiculopathy component - Plan: meloxicam (MOBIC) 15 MG tablet  Need for Tdap vaccination - Plan: Tdap vaccine greater than or equal to 7yo IM  Impaired fasting glucose - counseled re: diet, exercise, weight loss. Encouraged her to cut back on wine intake - Plan: Hemoglobin A1c, Glucose, random  Elevated LFTs - reviewed with pt, likely related to increased alcohol intake (and poss fatty liver) - Plan: Lipid panel  Excessive drinking of alcohol - counseled re: risks of alcohol and encouraged her to cut back (1 daily, occasional 2)   F/u 3 months Lipids, LFT, glu, A1c Tdap--one year late--recommend giving at [place] in case  visit will be virtual  NSAID precautions reviewed in detail Shingrix recommended, risks/SE reviewed; to check insurance and schedule [place] when convenient. Mammogram is due (last 09/2017), reminded to schedule.  Last CPE was 08/2016.  She cancelled her physical 12/2017 and 07/2018. She is due for vaccines--TdaP, discussion of Shingrix, flu shot. These were discussed today.  ROM exercises for shoulder shown, and discussed proper posture for neck.    Follow Up Instructions:    I discussed the assessment and treatment plan with the patient. The patient was provided an opportunity to ask questions and all were answered. The patient agreed with the plan and demonstrated an understanding of the instructions.   The patient was advised to call back or seek an in-person evaluation if the symptoms worsen or if the condition fails to improve as anticipated.  I provided 42 minutes of non-face-to-face time during this encounter. More than 1/2 was spent counseling (regarding past due health maintenance issues, imms, colon cancer screening; also dietary counseling for IFG, abnl lipids, and excess alcohol intake.   Vikki Ports, MD

## 2018-11-08 ENCOUNTER — Other Ambulatory Visit: Payer: 59

## 2018-11-08 ENCOUNTER — Other Ambulatory Visit: Payer: Self-pay

## 2018-11-08 DIAGNOSIS — E782 Mixed hyperlipidemia: Secondary | ICD-10-CM

## 2018-11-08 DIAGNOSIS — Z5181 Encounter for therapeutic drug level monitoring: Secondary | ICD-10-CM

## 2018-11-08 DIAGNOSIS — M791 Myalgia, unspecified site: Secondary | ICD-10-CM

## 2018-11-09 ENCOUNTER — Encounter: Payer: Self-pay | Admitting: Family Medicine

## 2018-11-09 ENCOUNTER — Ambulatory Visit (INDEPENDENT_AMBULATORY_CARE_PROVIDER_SITE_OTHER): Payer: 59 | Admitting: Family Medicine

## 2018-11-09 VITALS — BP 138/92 | HR 89 | Ht 65.0 in | Wt 174.0 lb

## 2018-11-09 DIAGNOSIS — E782 Mixed hyperlipidemia: Secondary | ICD-10-CM | POA: Diagnosis not present

## 2018-11-09 DIAGNOSIS — F1010 Alcohol abuse, uncomplicated: Secondary | ICD-10-CM

## 2018-11-09 DIAGNOSIS — M25511 Pain in right shoulder: Secondary | ICD-10-CM

## 2018-11-09 DIAGNOSIS — R7301 Impaired fasting glucose: Secondary | ICD-10-CM

## 2018-11-09 DIAGNOSIS — I10 Essential (primary) hypertension: Secondary | ICD-10-CM | POA: Diagnosis not present

## 2018-11-09 DIAGNOSIS — Z1211 Encounter for screening for malignant neoplasm of colon: Secondary | ICD-10-CM

## 2018-11-09 DIAGNOSIS — R945 Abnormal results of liver function studies: Secondary | ICD-10-CM

## 2018-11-09 DIAGNOSIS — R7989 Other specified abnormal findings of blood chemistry: Secondary | ICD-10-CM

## 2018-11-09 DIAGNOSIS — Z23 Encounter for immunization: Secondary | ICD-10-CM

## 2018-11-09 LAB — COMPREHENSIVE METABOLIC PANEL
ALT: 34 IU/L — ABNORMAL HIGH (ref 0–32)
AST: 22 IU/L (ref 0–40)
Albumin/Globulin Ratio: 2.6 — ABNORMAL HIGH (ref 1.2–2.2)
Albumin: 4.9 g/dL — ABNORMAL HIGH (ref 3.8–4.8)
Alkaline Phosphatase: 68 IU/L (ref 39–117)
BUN/Creatinine Ratio: 16 (ref 12–28)
BUN: 13 mg/dL (ref 8–27)
Bilirubin Total: 0.3 mg/dL (ref 0.0–1.2)
CO2: 23 mmol/L (ref 20–29)
Calcium: 9.9 mg/dL (ref 8.7–10.3)
Chloride: 102 mmol/L (ref 96–106)
Creatinine, Ser: 0.82 mg/dL (ref 0.57–1.00)
GFR calc Af Amer: 89 mL/min/{1.73_m2} (ref >59)
GFR calc non Af Amer: 77 mL/min/{1.73_m2} (ref >59)
Globulin, Total: 1.9 g/dL (ref 1.5–4.5)
Glucose: 117 mg/dL — ABNORMAL HIGH (ref 65–99)
Potassium: 4.8 mmol/L (ref 3.5–5.2)
Sodium: 142 mmol/L (ref 134–144)
Total Protein: 6.8 g/dL (ref 6.0–8.5)

## 2018-11-09 LAB — LIPID PANEL
Chol/HDL Ratio: 3.5 ratio (ref 0.0–4.4)
Cholesterol, Total: 264 mg/dL — ABNORMAL HIGH (ref 100–199)
HDL: 76 mg/dL (ref >39)
LDL Calculated: 147 mg/dL — ABNORMAL HIGH (ref 0–99)
Triglycerides: 204 mg/dL — ABNORMAL HIGH (ref 0–149)
VLDL Cholesterol Cal: 41 mg/dL — ABNORMAL HIGH (ref 5–40)

## 2018-11-09 LAB — COLOGUARD: Cologuard: NEGATIVE

## 2018-11-09 LAB — CK: Total CK: 110 U/L (ref 32–182)

## 2018-11-09 LAB — SEDIMENTATION RATE: Sed Rate: 6 mm/hr (ref 0–40)

## 2018-11-09 MED ORDER — ROSUVASTATIN CALCIUM 10 MG PO TABS: ORAL_TABLET | ORAL | 0 refills | Status: DC

## 2018-11-09 MED ORDER — MELOXICAM 15 MG PO TABS: 15.0000 mg | ORAL_TABLET | Freq: Every day | ORAL | 0 refills | Status: DC

## 2018-11-09 NOTE — Patient Instructions (Addendum)
Take the meloxicam once daily with food. Do not use ibuprofen/advil/motrin while taking this medication.  You CAN use tylenol/acetaminophen if needed for pain. If you continue to have pain in your shoulder and neck, I recommend you follow up with your orthopedist. We discussed doing range of motion exercises for your shoulder (see below).  You should be receiving box for Cologuard testing (screening for colon cancer).  We discussed that your cholesterol (LDL and triglycerides) were high, as well as your fasting sugar being elevated.  Your blood pressure is also above goal (goal is under 130/80; treatment adjustment is recommended if consistently 135/85 or higher, for either number). Please keep a running list of blood pressures, and bring it to your visit, along with your blood pressure monitor so that we can assess the accuracy of the monitor.  We discussed low sodium diet, as well as cutting back on the red meat in your diet (using more ground [place], more chicken and fish, cut back on mayonnaise, cheese).  We also discussed the need to cut back on your wine intake. Recommendation is one glass (5-6 ounce) daily or less.  Larger amounts can hurt the liver, increase the sugar, triglycerides, your weight, and your blood pressure. As we discussed, there is also a slight correlation with higher alcohol intake with higher risk of breast cancer.  Please schedule your mammogram.  I recommend getting the new shingles vaccine (Shingrix). You will need to check with your insurance to see if it is covered, and if covered, schedule a nurse visit at our office.  It is a series of 2 injections, spaced 2 months apart. Remember the potential side effects, so get it when you have nothing going on for a couple of days in case you are achey and feel flu-like.  Return in 3 months for fasting labs with an office visit afterwards to review the results.  We will go ahead and give you your tetanus shot (TdaP) at the nurse  visit, giving [place] the option for a virtual visit, if needed/preferred at that time.   Shoulder Range of Motion Exercises Shoulder range of motion (ROM) exercises are done to keep the shoulder moving freely or to increase movement. They are often recommended for people who have shoulder pain or stiffness or who are recovering from a shoulder surgery. Phase 1 exercises When you are able, do this exercise 1-2 times per day for 30-60 seconds in each direction, or as directed by your health care provider. Pendulum exercise To do this exercise while sitting: 1. Sit in a chair or at the edge of your bed with your feet flat on the floor. 2. Let your affected arm hang down in front of you over the edge of the bed or chair. 3. Relax your shoulder, arm, and hand. [place] your body so your arm gently swings in small circles. You can also use your unaffected arm to start the motion. 5. Repeat changing the direction of the circles, swinging your arm left and right, and swinging your arm forward and back. To do this exercise while standing: 1. Stand next to a sturdy chair or table, and hold on to it with your hand on your unaffected side. 2. Bend forward at the waist. 3. Bend your knees slightly. 4. Relax your shoulder, arm, and hand. 5. While keeping your shoulder relaxed, use body motion to swing your arm in small circles. 6. Repeat changing the direction of the circles, swinging your arm left and right, and  swinging your arm forward and back. 7. Between exercises, stand up tall and take a short break to relax your lower back.  Phase 2 exercises Do these exercises 1-2 times per day or as told by your health care provider. Hold each stretch for 30 seconds, and repeat 3 times. Do the exercises with one or both arms as instructed by your health care provider. For these exercises, sit at a table with your hand and arm supported by the table. A chair that slides easily or has wheels can be helpful. External  rotation 1. Turn your chair so that your affected side is nearest to the table. 2. Place your forearm on the table to your side. Bend your elbow about 90 at the elbow (right angle) and place your hand palm facing down on the table. Your elbow should be about 6 inches away from your side. 3. Keeping your arm on the table, lean your body forward. Abduction 1. Turn your chair so that your affected side is nearest to the table. 2. Place your forearm and hand on the table so that your thumb points toward the ceiling and your arm is straight out to your side. 3. Slide your hand out to the side and away from you, using your unaffected arm to do the work. 4. To increase the stretch, you can slide your chair away from the table. Flexion: forward stretch 1. Sit facing the table. Place your hand and elbow on the table in front of you. 2. Slide your hand forward and away from you, using your unaffected arm to do the work. 3. To increase the stretch, you can slide your chair backward. Phase 3 exercises Do these exercises 1-2 times per day or as told by your health care provider. Hold each stretch for 30 seconds, and repeat 3 times. Do the exercises with one or both arms as instructed by your health care provider. Cross-body stretch: posterior capsule stretch 1. Lift your arm straight out in front of you. 2. Bend your arm 90 at the elbow (right angle) so your forearm moves across your body. 3. Use your other arm to gently pull the elbow across your body, toward your other shoulder. Wall climbs 1. Stand with your affected arm extended out to the side with your hand resting on a door frame. 2. Slide your hand slowly up the door frame. 3. To increase the stretch, step through the door frame. Keep your body upright and do not lean. Wand exercises You will need a cane, a piece of PVC pipe, or a sturdy wooden dowel for wand exercises. Flexion To do this exercise while standing: 1. Hold the wand with both of  your hands, palms down. 2. Using the other arm to help, lift your arms up and over your head, if able. 3. Push upward with your other arm to gently increase the stretch. To do this exercise while lying down: 1. Lie on your back with your elbows resting on the floor and the wand in both your hands. Your hands will be palm down, or pointing toward your feet. 2. Lift your hands toward the ceiling, using your unaffected arm to help if needed. 3. Bring your arms overhead as able, using your unaffected arm to help if needed. Internal rotation 1. Stand while holding the wand behind you with both hands. Your unaffected arm should be extended above your head with the arm of the affected side extended behind you at the level of your waist. The wand  should be pointing straight up and down as you hold it. 2. Slowly pull the wand up behind your back by straightening the elbow of your unaffected arm and bending the elbow of your affected arm. External rotation 1. Lie on your back with your affected upper arm supported on a small pillow or rolled towel. When you first do this exercise, keep your upper arm close to your body. Over time, bring your arm up to a 90 angle out to the side. 2. Hold the wand across your stomach and with both hands palm up. Your elbow on your affected side should be bent at a 90 angle. 3. Use your unaffected side to help push your forearm away from you and toward the floor. Keep your elbow on your affected side bent at a 90 angle. Contact a health care provider if you have:  New or increasing pain.  New numbness, tingling, weakness, or discoloration in your arm or hand. This information is not intended to replace advice given to you by your health care provider. Make sure you discuss any questions you have with your health care provider. Document Released: 01/03/2003 Document Revised: 05/19/2017 Document Reviewed: 05/19/2017 Elsevier Patient Education  2020 [place]  and Cholesterol Restricted Eating Plan Getting too much fat and cholesterol in your diet may cause health problems. Choosing the right foods helps keep your fat and cholesterol at normal levels. This can keep you from getting certain diseases. Meal planning  At meals, divide your plate into four equal parts: ? Fill one-half of your plate with vegetables and green salads. ? Fill one-fourth of your plate with whole grains. ? Fill one-fourth of your plate with low-fat (lean) protein foods.  Eat fish that is high in omega-3 fats at least two times a week. This includes mackerel, tuna, sardines, and salmon.  Eat foods that are high in fiber, such as whole grains, beans, apples, broccoli, carrots, peas, and barley. General tips   Work with your doctor to lose weight if you need to.  Avoid: ? Foods with added sugar. ? Fried foods. ? Foods with partially hydrogenated oils.  Limit alcohol intake to no more than 1 drink a day for nonpregnant women and 2 drinks a day for men. One drink equals 12 oz of beer, 5 oz of wine, or 1 oz of hard liquor. Reading food labels  Check food labels for: ? Trans fats. ? Partially hydrogenated oils. ? Saturated fat (g) in each serving. ? Cholesterol (mg) in each serving. ? Fiber (g) in each serving.  Choose foods with healthy fats, such as: ? Monounsaturated fats. ? Polyunsaturated fats. ? Omega-3 fats.  Choose grain products that have whole grains. Look for the word "whole" as the first word in the ingredient list. Cooking  Cook foods using low-fat methods. These include baking, boiling, grilling, and broiling.  Eat more home-cooked foods. Eat at restaurants and buffets less often.  Avoid cooking using saturated fats, such as butter, cream, palm oil, palm kernel oil, and coconut oil.   Recommended foods  Fruits  All fresh, canned (in natural juice), or frozen fruits. Vegetables  Fresh or frozen vegetables (raw, steamed, roasted, or  grilled). Green salads. Grains  Whole grains, such as whole wheat or whole grain breads, crackers, cereals, and pasta. Unsweetened oatmeal, bulgur, barley, quinoa, or brown rice. Corn or whole wheat flour tortillas. Meats and other protein foods  Ground beef (85% or leaner), grass-fed beef, or beef trimmed of fat.  Skinless chicken or [place]. Ground chicken or [place]. Pork trimmed of fat. All fish and seafood. Egg whites. Dried beans, peas, or lentils. Unsalted nuts or seeds. Unsalted canned beans. Nut butters without added sugar or oil. Dairy  Low-fat or nonfat dairy products, such as skim or 1% milk, 2% or reduced-fat cheeses, low-fat and fat-free ricotta or cottage cheese, or plain low-fat and nonfat yogurt. Fats and oils  Tub margarine without trans fats. Light or reduced-fat mayonnaise and salad dressings. Avocado. Olive, canola, sesame, or safflower oils. The items listed above may not be a complete list of foods and beverages you can eat. Contact a dietitian for more information.   Foods to avoid Fruits  Canned fruit in heavy syrup. Fruit in cream or butter sauce. Fried fruit. Vegetables  Vegetables cooked in cheese, cream, or butter sauce. Fried vegetables. Grains  White bread. White pasta. White rice. Cornbread. Bagels, pastries, and croissants. Crackers and snack foods that contain trans fat and hydrogenated oils. Meats and other protein foods  Fatty cuts of meat. Ribs, chicken wings, bacon, sausage, bologna, salami, chitterlings, fatback, hot dogs, bratwurst, and packaged lunch meats. Liver and organ meats. Whole eggs and egg yolks. Chicken and [place] with skin. Fried meat. Dairy  Whole or 2% milk, cream, half-and-half, and cream cheese. Whole milk cheeses. Whole-fat or sweetened yogurt. Full-fat cheeses. Nondairy creamers and whipped toppings. Processed cheese, cheese spreads, and cheese curds. Beverages  Alcohol. Sugar-sweetened drinks such as sodas, lemonade, and fruit  drinks. Fats and oils  Butter, stick margarine, lard, shortening, ghee, or bacon fat. Coconut, palm kernel, and palm oils. Sweets and desserts  Corn syrup, sugars, honey, and molasses. Candy. Jam and jelly. Syrup. Sweetened cereals. Cookies, pies, cakes, donuts, muffins, and ice cream. The items listed above may not be a complete list of foods and beverages you should avoid. Contact a dietitian for more information. Summary  Choosing the right foods helps keep your fat and cholesterol at normal levels. This can keep you from getting certain diseases.  At meals, fill one-half of your plate with vegetables and green salads.  Eat high-fiber foods, like whole grains, beans, apples, carrots, peas, and barley.  Limit added sugar, saturated fats, alcohol, and fried foods. This information is not intended to replace advice given to you by your health care provider. Make sure you discuss any questions you have with your health care provider. Document Released: 10/06/2011 Document Revised: 12/08/2017 Document Reviewed: 12/22/2016 Elsevier Patient Education  2020 [employer].

## 2019-01-11 ENCOUNTER — Encounter: Payer: Self-pay | Admitting: *Deleted

## 2019-02-01 ENCOUNTER — Encounter: Payer: Self-pay | Admitting: Family Medicine

## 2019-02-09 ENCOUNTER — Encounter: Payer: Self-pay | Admitting: Family Medicine

## 2019-02-13 ENCOUNTER — Encounter: Payer: Self-pay | Admitting: *Deleted

## 2019-02-13 ENCOUNTER — Other Ambulatory Visit: Payer: Self-pay

## 2019-02-13 ENCOUNTER — Encounter: Payer: Self-pay | Admitting: Family Medicine

## 2019-02-13 ENCOUNTER — Telehealth: Payer: Self-pay | Admitting: Cardiovascular Disease

## 2019-02-13 NOTE — Telephone Encounter (Signed)
Spoke to pt about concerns with BP. Stated early this morning she was SOB, dizzy, chest pain and lightheadedness. Advised pt when BP is that high and she is symptomatic to definitely get someone to take her the ER and/or call 911. Asked pt to get a current BP; 174/80. She says she just feels funny. Advised pt to take her BP medications and wait a couple hours and check again. Educated pt that if her BP is still high and she is having these symptoms to go to the ER. Verbalized understanding. Kept appointment for 02/16/2019.

## 2019-02-13 NOTE — Progress Notes (Signed)
Cardiology Office Note:   Date:  02/16/2019  NAME:  Laura Payne    MRN: [id number] DOB:  August 06, 1955   PCP:  Rita Ohara, MD  Cardiologist:  Evalina Field, MD   Referring MD: Rita Ohara, MD   Chief Complaint  Patient presents with  . Palpitations   History of Present Illness:   Laura Payne is a 63 y.o. female with a hx of hypertension who is being seen today for the evaluation of palpitations at the request of Rita Ohara, MD.  She reports for the past 4 to 5 weeks she has noticed a sensation of rapid heartbeats.  She reports it happens several times per day they can occur while sitting or with exertion.  She does report anxiety as well.  She reports her mother had atrial fibrillation and is concerned that she may also have this condition.  She reports she can get short of breath with exertion but is mainly able to complete all tasks of daily without any limitations.  She does not routinely exercise.  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  She does report that when the palpitations can occur she does get shortness of breath with this.  She has had no syncopal episodes.  Prior to 4 to 5 weeks ago she never had these episodes.  She reports significant stress and anxiety with the coronavirus pandemic, as well as with the upcoming presidential election.  She does report increased alcohol use over the past few months up to 3 glasses per night.  Cardiovascular risk factors include former smoking history and hypertension.  Review of most recent LDL cholesterol not that bad.  She takes an aspirin daily.  Blood pressure today is 166/84 and she is recently started taking her lisinopril 10 mg twice daily.  Past Medical History: Past Medical History:  Diagnosis Date  . Allergy   . Dyslipidemia    hypercholesterolemia  . GERD (gastroesophageal reflux disease)   . Goiter    u/s 07/2004, generalized enlargement without cysts or nodules; normal TSH  . History  of rib fracture    chronic right posterior XII rib fx seen on CT 02/2005  . Hx gestational diabetes   . Hypertension   . Lipoma of axilla    h/o left  . Low sodium levels    history of low sodium with diuretics in the past.    Past Surgical History: Past Surgical History:  Procedure Laterality Date  . TONSILLECTOMY      Current Medications: No outpatient medications have been marked as taking for the 02/16/19 encounter (Office Visit) with Geralynn Rile, MD.     Allergies:    Codeine, Hctz [hydrochlorothiazide], Latex, and Penicillins   Social History: Social History   Socioeconomic History  . Marital status: Divorced    Spouse name: Not on file  . Number of children: 1  . Years of education: Not on file  . Highest education level: Not on file  Occupational History  . Occupation: Nurse, children's: [employer]  . Financial resource strain: Not on file  . Food insecurity    Worry: Not on file    Inability: Not on file  . Transportation needs    Medical: Not on file    Non-medical: Not on file  Tobacco Use  . Smoking status: Former Smoker    Packs/day: 0.10    Years: 41.00  Pack years: 4.10    Types: Cigarettes    Quit date: 10/07/2013    Years since quitting: 5.3  . Smokeless tobacco: Never Used  Substance and Sexual Activity  . Alcohol use: Yes    Alcohol/week: 0.0 standard drinks    Comment: 2 glasses of wine every evening (12 ounce total)  . Drug use: No  . Sexual activity: Yes  Lifestyle  . Physical activity    Days per week: Not on file    Minutes per session: Not on file  . Stress: Not on file  Relationships  . Social Herbalist on phone: Not on file    Gets together: Not on file    Attends religious service: Not on file    Active member of club or organization: Not on file    Attends meetings of clubs or organizations: Not on file    Relationship status: Not on file  Other Topics Concern  . Not on  file  Social History Narrative   Lives with her son. Son works for her (did not finish Water quality scientist). no pets.   Architect. 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 Monogamous, committed relationship     Family History: The patient's family history includes Arthritis in her cousin; Atrial fibrillation in her mother; Breast cancer (age of onset: 90) in her paternal grandmother; Cancer in her maternal grandfather and mother; Cancer (age of onset: 52) in her father; Cancer (age of onset: 62) in her paternal uncle; Crohn's disease in her cousin; Dementia in her mother; Diabetes in her cousin and maternal uncle; Heart disease in her mother; Hypertension in her mother; Osteoporosis in her mother; Ovarian cancer in her mother; Psoriasis in her cousin; Transient ischemic attack in her mother.  ROS:   All other ROS reviewed and negative. Pertinent positives noted in the HPI.     EKGs/Labs/Other Studies Reviewed:   The following studies were personally reviewed by me today:  EKG:  EKG is ordered today.  The ekg ordered today demonstrates normal sinus rhythm, heart rate 86, inferior Q waves noted, no acute ST-T changes, and was personally reviewed by me.   Recent Labs: 02/24/2018: Hemoglobin 14.1; Platelets 304 11/08/2018: BUN 13; Creatinine, Ser 0.82; Potassium 4.8; Sodium 142 02/15/2019: ALT 33; TSH WILL FOLLOW   Recent Lipid Panel    Component Value Date/Time   CHOL 206 (H) 02/15/2019 1045   TRIG 245 (H) 02/15/2019 1045   HDL 60 02/15/2019 1045   CHOLHDL 3.4 02/15/2019 1045   CHOLHDL 3.0 11/03/2016 1230   VLDL 35 (H) 11/03/2016 1230   LDLCALC 104 (H) 02/15/2019 1045    Physical Exam:   VS:  BP (!) 166/84   Pulse 96   Ht 5\' 5"  (1.651 m)   Wt 177 lb 6.4 oz (80.5 kg)   BMI 29.52 kg/m    Wt Readings from Last 3 Encounters:  02/16/19 177 lb 6.4 oz (80.5 kg)  02/15/19 176 lb (79.8 kg)  11/09/18 174 lb (78.9 kg)    General: Well nourished, well  developed, in no acute distress Heart: Atraumatic, normal size  Eyes: PEERLA, EOMI  Neck: Supple, no JVD Endocrine: No thryomegaly Cardiac: Faint 1 out of 6 systolic ejection murmur Lungs: Clear to auscultation bilaterally, no wheezing, rhonchi or rales  Abd: Soft, nontender, no hepatomegaly  Ext: No edema, pulses 2+ Musculoskeletal: No deformities, BUE and BLE strength normal and equal Skin: Warm and dry, no rashes  Neuro: Alert and oriented to person, place, time, and situation, CNII-XII grossly intact, no focal deficits  Psych: Normal mood and affect   ASSESSMENT:   Laura Payne is a 63 y.o. female who presents for the following: 1. Palpitations   2. Essential hypertension   3. Mixed hyperlipidemia     PLAN:   1. Palpitations -EKG with normal sinus rhythm and inferior Q waves today.  We will proceed with an echocardiogram.  We also will obtain a 7-day ZIO patch.  Given her symptoms and change in condition we will get an updated TSH today. -I do have concern for underlying atrial fibrillation or arrhythmia.  She has significant risk factors of hypertension, overweight, prior smoking history.  We will await further testing pending the above work-up.  2. Essential hypertension -Blood pressure elevated today -I will increase her lisinopril to 40 mg daily  3. Mixed hyperlipidemia -LDL 104; continue current therapy   Disposition: Return in about 6 weeks (around 03/30/2019).  Medication Adjustments/Labs and Tests Ordered: Current medicines are reviewed at length with the patient today.  Concerns regarding medicines are outlined above.  Orders Placed This Encounter  Procedures  . TSH  . LONG TERM MONITOR (3-14 DAYS)  . ECHOCARDIOGRAM COMPLETE   Meds ordered this encounter  Medications  . DISCONTD: lisinopril (ZESTRIL) 40 MG tablet    Sig: Take 1 tablet (40 mg total) by mouth daily.    Dispense:  90 tablet    Refill:  3  . lisinopril (ZESTRIL) 40 MG tablet    Sig:  Take 1 tablet (40 mg total) by mouth daily.    Dispense:  90 tablet    Refill:  3    CANCEL FIRST RX    Patient Instructions  Medication Instructions:   CHANGE TO LISINOPRIL 40 MG DAILY  If you need a refill on your cardiac medications before your next appointment, please call your pharmacy*  Lab Work: TSH   Testing/Procedures: Your physician has requested that you have an echocardiogram. Echocardiography is a painless test that uses sound waves to create images of your heart. It provides your doctor with information about the size and shape of your heart and how well your heart's chambers and valves are working. This procedure takes approximately one hour. There are no restrictions for this procedure.   AND  Your physician has recommended that you wear a 7 DAY ZIO-PATCH monitor. The Zio patch cardiac monitor continuously records heart rhythm data for up to 14 days, this is for patients being evaluated for multiple types heart rhythms. For the first 24 hours post application, please avoid getting the Zio monitor wet in the shower or by excessive sweating during exercise. After that, feel free to carry on with regular activities. Keep soaps and lotions away from the ZIO XT Patch.  This will be mailed to you, please expect 7-10 days to receive.  placed at our [facility] location - [address], [address].        Follow-Up: At [facility], you and your health needs are our priority.  As part of our continuing mission to provide you with exceptional heart care, we have created designated Provider Care Teams.  These Care Teams include your primary Cardiologist (physician) and Advanced Practice Providers (APPs -  Physician Assistants and Nurse Practitioners) who all work together to provide you with the care you need, when you need it.  Your next appointment:   6 WEEKS  The format for your  next appointment:   In Person  Provider:   Eleonore Chiquito, MD  Other Instructions      Signed, Addison Naegeli. Audie Box, [place]  [address], [place], [place] [address] [phone]  02/16/2019 8:04 PM

## 2019-02-13 NOTE — Telephone Encounter (Signed)
New Message     Patient states over the weekend she had issues with Palpitations, chest pain, SOB, and dizziness.  This morning her BP 183/89 HR-131 then it dropped to 151/83 HR-70 around 1pm today and she's feeling fine now.  She would like to speak to a nurse to see what she should be doing when she has issues like that.

## 2019-02-14 NOTE — Progress Notes (Signed)
Chief Complaint  Patient presents with  . Hypertension    fasting follow up on hypertension and cholesterol. Did not bring her cuff but has a list of the numbers. Has been experiencing more frequent HA's lately, not sure if they are sinus related or not.    Patient presents for follow-up on cholesterol, and hypertension. Earlier this week she experienced very high blood pressures, having some issues with tachycardia, didn't feel right.  She contacted cardiology and has visit scheduled for tomorrow.  She is worried she has atrial fibrillation (her mother does).  Monday of this week she felt lightheaded, strange, BP was 184/89.  She had trouble saying "blood pressure" to coworker, which caused her to panic and schedule appointment with cardiologist.  She reports that 4-6 weeks ago, she woke up in the middle of the night with her heart racing.  It was fine the next day.  2 weeks later, at beach with her girlfriends, and her heart was racing while resting in the sun on the deck.   This past weekend her heart was racing all weekend (but felt fine, until Monday, as reported above). Unsure if correlates to increased drinking of wine. Pulse per watch is 120-130 during these episodes. She has had some shortness of breath with activity just since Sunday/Monday.  Denies any chest pain.  Hyperlipidemia: She is taking Crestor, tolerating this well without problems,whiletaking along with coenzyme Q10.  At her last visit 3 months ago she reported increasing her alcohol intake (3-4 glasses most nights, up from 1-2), and was having more tuna (with mayo) and more red meat.  She cut out mayonnaise, red meat (cut meat portions in general), cut back on wine (2 glasses/night, none the last 2 nights). Lab Results  Component Value Date   CHOL 264 (H) 11/08/2018   HDL 76 11/08/2018   LDLCALC 147 (H) 11/08/2018   TRIG 204 (H) 11/08/2018   CHOLHDL 3.5 11/08/2018   This was up from the last prior check: Recent Labs        Lab Results  Component Value Date   CHOL 190 11/03/2016   HDL 64 11/03/2016   LDLCALC 91 11/03/2016   TRIG 175 (H) 11/03/2016   CHOLHDL 3.0 11/03/2016     Hypertension. She reports compliance with lisinopril 10mg  daily.  BP's have been running over 150 in the last 2-3 weeks.  Prior to that had been in the 130's/80's.  It was 183/89 the other day, and she felt "weird". Denies dizziness. She has had some more headaches lately (see below).  She is walking briskly 30 minutes five days/week.   She has been having more headaches.  Sometimes they are facial, related to sinuses.  Sometimes they start in her neck, and radiates around to her nose (one side or the other, has occurred on both sides). Once a headache lasted 24 hours, not relieved by advil.  That day she had some nausea.  No light/sound sensitivity. She has chronic allergies and congestion. She used to take generic claritin, feels like it stopped working, so stopped using it.  She does sinus rinses infrequently, with good results.  Impaired fasting glucose: H/o GDM. Tries to limit carbs/sweets/sugar. She does, except for the wine.  She has cut back on wine since last visit, to 2/day.  A1c's and fasting sugars had been normal,until 02/2018, fasting sugar 117, (prior elevated sugar was 2014).  She is fasting for labs today.  Lab Results  Component Value Date   HGBA1C 5.6  02/24/2018   Vitamin D deficiency:Last level was 34.6 in 02/2018, when taking 2000 IU of gummy vitamin D3 daily (along with gummy MVI).  Previously had been low, with level of 20 in 08/2016; prior to that was11 in November, 2017.   She continues to take 2000 IU daily.  GERD: She has been takingNexium OTCwith good results,needs to take it daily. She has recurrent symptoms with missed doses.(previously took rx doses, no longer needed). Avoids carbs, bagels, as it triggers reflux, dysphagia. No longer having issues with dysphagia (only with large  pills).  Right shoulder pain--prescribed meloxicam and given ROM exercises at her virtual visit in July.  It really helped, pain is much better, and mobility has improved. Not completely better, but significant improved. Per 10/2018 virtual visit: Right shoulder pain since January.  She was cleaning a beach house (including light fixtures) the week between Christmas and [place] She went to ortho in Jan/Feb ([facility]), had x-ray, wanted her to have MRI, but she got better.    Currently, she still has some pain at the shoulder and down the back of the upper arm.  No longer hurts into her neck. No numbness, tingling or weakness. Compound motions (turning steering wheel), hurts more.   PMH, PSH, SH reviewed  Outpatient Encounter Medications as of 02/15/2019  Medication Sig Note  . aspirin 81 MG tablet Take 81 mg by mouth daily.    . Cholecalciferol (VITAMIN D) 2000 units CAPS Take 1 capsule by mouth. 02/24/2018: daily  . Coenzyme Q10 (COQ10 PO) Take 1 capsule by mouth daily.   Marland Kitchen esomeprazole (NEXIUM) 20 MG capsule Take 20 mg by mouth daily at 12 noon.    Marland Kitchen lisinopril (ZESTRIL) 10 MG tablet Take 1 tablet (10 mg total) by mouth daily.   . Multiple Vitamins-Minerals (WOMENS 50+ MULTI VITAMIN/MIN) TABS Take 1 tablet by mouth daily.   . rosuvastatin (CRESTOR) 10 MG tablet TAKE 1 TABLET(10 MG) BY MOUTH DAILY   . ibuprofen (ADVIL,MOTRIN) 200 MG tablet Take 800 mg by mouth every 6 (six) hours as needed.   . TURMERIC PO Take 2 tablets by mouth daily. 02/15/2019: Too big of a pill to swallow  . [DISCONTINUED] meloxicam (MOBIC) 15 MG tablet Take 1 tablet (15 mg total) by mouth daily.    No facility-administered encounter medications on file as of 02/15/2019.    Allergies  Allergen Reactions  . Codeine Other (See Comments)    Would make her start her period. Also just affected her body poorly so she just does not take it.  Marland Kitchen Hctz [Hydrochlorothiazide] Other (See Comments)    Hyponatremia   . Latex Rash   . Penicillins Rash    ROS: no fever, chills, chest pain.  She is having tachycardia/palpitations as per HPI, with some DOE over the last few days.  Currently she feels fine. No cough, shortness of breath.  Denies myalgias. She has been feeling very anxious, worried she could have afib.  She denies bleeding, bruising, rash. Bowels are normal.  Headaches per HPI.   PHYSICAL EXAM:  BP 140/80   Pulse 80   Temp (!) 97.5 F (36.4 C) (Other (Comment))   Ht 5\' 5"  (1.651 m)   Wt 176 lb (79.8 kg)   BMI 29.29 kg/m   Wt Readings from Last 3 Encounters:  02/15/19 176 lb (79.8 kg)  11/09/18 174 lb (78.9 kg)  02/24/18 179 lb 12.8 oz (81.6 kg)   Repeat BP 148/80  Well-appearing, pleasant female, is mildly  anxious. HEENT: conjunctiva and sclera are clear, EOMI.  Nasal mucosa is moderately edematous on the left, mild on the right. No erythema or purulence. She is mildly tender over both maxillary sinuses.  Nontender over frontal sinuses (though reports that she often has pain at her eyebrows). Neck: no lymphadenopathy, thyromegaly or mass, no carotid bruit Heart: regular rate and rhythm, trace murmur at the RUSB.  No ectopic beats. Lungs: clear bilaterally Back: no spinal or CVA tenderness Abdomen: soft, nontender, no organomegaly or mass Extremities: no edema, normal pulses. FROM at right shoulder. Psych: somewhat anxious mood, normal affect, hygiene and grooming Neuro: alert and oriented, normal gait   ASSESSMENT/PLAN:  Essential hypertension, benign - BP elevated. Higher in the last week since more anxious about heart. borderline prior to this. Await cardiology eval to adjust meds. Low Na diet, exercise  Need for Tdap vaccination - Plan: Tdap vaccine greater than or equal to 7yo IM  Need for influenza vaccination - Plan: Flu Vaccine QUAD 6+ mos PF IM (Fluarix Quad PF)  Elevated LFTs - reviewed with pt, likely related to increased alcohol intake (and poss fatty liver) - Plan: Lipid  panel  Impaired fasting glucose - counseled re: diet, exercise - Plan: Hemoglobin A1c, Glucose, random  Mixed hyperlipidemia - Due for recheck today - Plan: Hepatic function panel  Palpitations - and tachycardia.  Has cardiology eval tomorrow. Anticipate echo and monitor will be done.  Allergic rhinitis, unspecified seasonality, unspecified trigger - restart antihistamine, discussed inhaled nasal steroids and sinus rinses prn   TSH was added on to the released labs (that were previously ordered), due to her tachycardia/palpitations.  BP is above goal, anxiety about her heart may be contributing some.   We discussed increasing lisinopril dose to help lower BP, but since her appt with cardiologist is tomorrow, recommend holding off until she sees them. Briefly discussed that if arrhythmia found/suspected, might start calcium channel or beta blocker to treat symptoms/control rate, rather than just increasing lisinopril, so will hold off on changes until she sees them tomorrow.   WAIT FOR LABS BACK FOR CRESTOR WAIT FOR CARDIOLOGY FOR LISINOPRIL REFILL   Blood pressures are not at goal.  Wasn't too bad when in the 130's, but clearly worse in the last few weeks. Possibly related to stress, anxiety, which may be related to the issues with heart racing. I'm going to wait to raise your lisinopril dose, as there may be a different medication that might work better, depending what the cardiologist finds (ie adding a beta blocker or calcium channel blocker to help with your pulse/rhythm, rather than just increasing the lisinopril dose).  Bring your home blood pressure monitor with you to the cardiologist office to have them check the accuracy of it.  Stay well hydrated. Please try and limit the wine intake, which may have an effect on your heart.  Restart an antihistamine (you can try a different one, such as zyrtec or allegra). You may need to consider also adding in a nasal steroid such as Flonase  or Nasacort (these are over-the-counter, but also available in prescription, whichever is less expensive).  Try resuming sinus rinses more regularly to see if this helps your sinus headaches. You can also consider adding mucinex/guaifenesin to help keep the mucus and phlegm thinner.

## 2019-02-15 ENCOUNTER — Encounter: Payer: Self-pay | Admitting: Family Medicine

## 2019-02-15 ENCOUNTER — Other Ambulatory Visit: Payer: Self-pay

## 2019-02-15 ENCOUNTER — Ambulatory Visit (INDEPENDENT_AMBULATORY_CARE_PROVIDER_SITE_OTHER): Payer: 59 | Admitting: Family Medicine

## 2019-02-15 VITALS — BP 140/80 | HR 80 | Temp 97.5°F | Ht 65.0 in | Wt 176.0 lb

## 2019-02-15 DIAGNOSIS — E782 Mixed hyperlipidemia: Secondary | ICD-10-CM

## 2019-02-15 DIAGNOSIS — R002 Palpitations: Secondary | ICD-10-CM

## 2019-02-15 DIAGNOSIS — I10 Essential (primary) hypertension: Secondary | ICD-10-CM | POA: Diagnosis not present

## 2019-02-15 DIAGNOSIS — R7301 Impaired fasting glucose: Secondary | ICD-10-CM | POA: Diagnosis not present

## 2019-02-15 DIAGNOSIS — Z23 Encounter for immunization: Secondary | ICD-10-CM

## 2019-02-15 DIAGNOSIS — R7989 Other specified abnormal findings of blood chemistry: Secondary | ICD-10-CM

## 2019-02-15 DIAGNOSIS — J309 Allergic rhinitis, unspecified: Secondary | ICD-10-CM

## 2019-02-15 NOTE — Patient Instructions (Addendum)
Blood pressures are not at goal.  Wasn't too bad when in the 130's, but clearly worse in the last few weeks. Possibly related to stress, anxiety, which may be related to the issues with heart racing. I'm going to wait to raise your lisinopril dose, as there may be a different medication that might work better, depending what the cardiologist finds (ie adding a beta blocker or calcium channel blocker to help with your pulse/rhythm, rather than just increasing the lisinopril dose).  Bring your home blood pressure monitor with you to the cardiologist office to have them check the accuracy of it.  Stay well hydrated. Please try and limit the wine intake, which may have an effect on your heart.  Restart an antihistamine (you can try a different one, such as zyrtec or allegra). You may need to consider also adding in a nasal steroid such as Flonase or Nasacort (these are over-the-counter, but also available in prescription, whichever is less expensive).  Try resuming sinus rinses more regularly to see if this helps your sinus headaches. You can also consider adding mucinex/guaifenesin to help keep the mucus and phlegm thinner.   Mindfulness-Based Stress Reduction Mindfulness-based stress reduction (MBSR) is a program that helps people learn to practice mindfulness. Mindfulness is the practice of intentionally paying attention to the present moment. It can be learned and practiced through techniques such as education, breathing exercises, meditation, and yoga. MBSR includes several mindfulness techniques in one program. MBSR works best when you understand the treatment, are willing to try new things, and can commit to spending time practicing what you learn. MBSR training may include learning about:  How your emotions, thoughts, and reactions affect your body.  New ways to respond to things that cause negative thoughts to start (triggers).  How to notice your thoughts and let go of them.  Practicing  awareness of everyday things that you normally do without thinking.  The techniques and goals of different types of meditation. What are the benefits of MBSR? MBSR can have many benefits, which include helping you to:  Develop self-awareness. This refers to knowing and understanding yourself.  Learn skills and attitudes that help you to participate in your own health care.  Learn new ways to care for yourself.  Be more accepting about how things are, and let things go.  Be less judgmental and approach things with an open mind.  Be patient with yourself and trust yourself more. MBSR has also been shown to:  Reduce negative emotions, such as depression and anxiety.  Improve memory and focus.  Change how you sense and approach pain.  Boost your body's ability to fight infections.  Help you connect better with other people.  Improve your sense of well-being. Follow these instructions at home:   Find a local in-person or online MBSR program.  Set aside some time regularly for mindfulness practice.  Find a mindfulness practice that works best for you. This may include one or more of the following: ? Meditation. Meditation involves focusing your mind on a certain thought or activity. ? Breathing awareness exercises. These help you to stay present by focusing on your breath. ? Body scan. For this practice, you lie down and pay attention to each part of your body from head to toe. You can identify tension and soreness and intentionally relax parts of your body. ? Yoga. Yoga involves stretching and breathing, and it can improve your ability to move and be flexible. It can also provide an experience of  testing your body's limits, which can help you release stress. ? Mindful eating. This way of eating involves focusing on the taste, texture, color, and smell of each bite of food. Because this slows down eating and helps you feel full sooner, it can be an important part of a weight-loss  plan.  Find a podcast or recording that provides guidance for breathing awareness, body scan, or meditation exercises. You can listen to these any time when you have a free moment to rest without distractions.  Follow your treatment plan as told by your health care provider. This may include taking regular medicines and making changes to your diet or lifestyle as recommended. How to practice mindfulness To do a basic awareness exercise:  Find a comfortable place to sit.  Pay attention to the present moment. Observe your thoughts, feelings, and surroundings just as they are.  Avoid placing judgment on yourself, your feelings, or your surroundings. Make note of any judgment that comes up, and let it go.  Your mind may wander, and that is okay. Make note of when your thoughts drift, and return your attention to the present moment. To do basic mindfulness meditation:  Find a comfortable place to sit. This may include a stable chair or a firm floor cushion. ? Sit upright with your back straight. Let your arms fall next to your side with your hands resting on your legs. ? If sitting in a chair, rest your feet flat on the floor. ? If sitting on a cushion, cross your legs in front of you.  Keep your head in a neutral position with your chin dropped slightly. Relax your jaw and rest the tip of your tongue on the roof of your mouth. Drop your gaze to the floor. You can close your eyes if you like.  Breathe normally and pay attention to your breath. Feel the air moving in and out of your nose. Feel your belly expanding and relaxing with each breath.  Your mind may wander, and that is okay. Make note of when your thoughts drift, and return your attention to your breath.  Avoid placing judgment on yourself, your feelings, or your surroundings. Make note of any judgment or feelings that come up, let them go, and bring your attention back to your breath.  When you are ready, lift your gaze or open  your eyes. Pay attention to how your body feels after the meditation. Where to find more information You can find more information about MBSR from:  Your health care provider.  Community-based meditation centers or programs.  Programs offered near you. Summary  Mindfulness-based stress reduction (MBSR) is a program that teaches you how to intentionally pay attention to the present moment. It is used with other treatments to help you cope better with daily stress, emotions, and pain.  MBSR focuses on developing self-awareness, which allows you to respond to life stress without judgment or negative emotions.  MBSR programs may involve learning different mindfulness practices, such as breathing exercises, meditation, yoga, body scan, or mindful eating. Find a mindfulness practice that works best for you, and set aside time for it on a regular basis. This information is not intended to replace advice given to you by your health care provider. Make sure you discuss any questions you have with your health care provider. Document Released: 08/13/2016 Document Revised: 03/19/2017 Document Reviewed: 08/13/2016 Elsevier Patient Education  2020 [employer].

## 2019-02-16 ENCOUNTER — Encounter: Payer: Self-pay | Admitting: Family Medicine

## 2019-02-16 ENCOUNTER — Encounter: Payer: Self-pay | Admitting: Cardiovascular Disease

## 2019-02-16 ENCOUNTER — Ambulatory Visit (INDEPENDENT_AMBULATORY_CARE_PROVIDER_SITE_OTHER): Payer: 59 | Admitting: Cardiovascular Disease

## 2019-02-16 ENCOUNTER — Ambulatory Visit: Payer: Self-pay | Admitting: Family Medicine

## 2019-02-16 VITALS — BP 166/84 | HR 96 | Ht 65.0 in | Wt 177.4 lb

## 2019-02-16 DIAGNOSIS — I10 Essential (primary) hypertension: Secondary | ICD-10-CM

## 2019-02-16 DIAGNOSIS — E782 Mixed hyperlipidemia: Secondary | ICD-10-CM | POA: Diagnosis not present

## 2019-02-16 DIAGNOSIS — R002 Palpitations: Secondary | ICD-10-CM | POA: Diagnosis not present

## 2019-02-16 LAB — LIPID PANEL
Chol/HDL Ratio: 3.4 ratio (ref 0.0–4.4)
Cholesterol, Total: 206 mg/dL — ABNORMAL HIGH (ref 100–199)
HDL: 60 mg/dL (ref >39)
LDL Chol Calc (NIH): 104 mg/dL — ABNORMAL HIGH (ref 0–99)
Triglycerides: 245 mg/dL — ABNORMAL HIGH (ref 0–149)
VLDL Cholesterol Cal: 42 mg/dL — ABNORMAL HIGH (ref 5–40)

## 2019-02-16 LAB — HEMOGLOBIN A1C
Est. average glucose Bld gHb Est-mCnc: 114 mg/dL
Hgb A1c MFr Bld: 5.6 % (ref 4.8–5.6)

## 2019-02-16 LAB — HEPATIC FUNCTION PANEL
ALT: 33 IU/L — ABNORMAL HIGH (ref 0–32)
AST: 23 IU/L (ref 0–40)
Albumin: 4.6 g/dL (ref 3.8–4.8)
Alkaline Phosphatase: 73 IU/L (ref 39–117)
Bilirubin Total: 0.4 mg/dL (ref 0.0–1.2)
Bilirubin, Direct: 0.08 mg/dL (ref 0.00–0.40)
Total Protein: 6.7 g/dL (ref 6.0–8.5)

## 2019-02-16 LAB — TSH: TSH: 1.03 u[IU]/mL (ref 0.450–4.500)

## 2019-02-16 LAB — GLUCOSE, RANDOM: Glucose: 111 mg/dL — ABNORMAL HIGH (ref 65–99)

## 2019-02-16 LAB — SPECIMEN STATUS REPORT

## 2019-02-16 MED ORDER — LISINOPRIL 40 MG PO TABS: 40.0000 mg | ORAL_TABLET | Freq: Every day | ORAL | 3 refills | Status: DC

## 2019-02-16 MED ORDER — ROSUVASTATIN CALCIUM 10 MG PO TABS: ORAL_TABLET | ORAL | 1 refills | Status: DC

## 2019-02-16 NOTE — Telephone Encounter (Signed)
Patient was seen today 02/16/19  Medication changes and echo ordered

## 2019-02-16 NOTE — Patient Instructions (Addendum)
Medication Instructions:   CHANGE TO LISINOPRIL 40 MG DAILY  If you need a refill on your cardiac medications before your next appointment, please call your pharmacy*  Lab Work: TSH   Testing/Procedures: Your physician has requested that you have an echocardiogram. Echocardiography is a painless test that uses sound waves to create images of your heart. It provides your doctor with information about the size and shape of your heart and how well your heart's chambers and valves are working. This procedure takes approximately one hour. There are no restrictions for this procedure.   AND  Your physician has recommended that you wear a 7 DAY ZIO-PATCH monitor. The Zio patch cardiac monitor continuously records heart rhythm data for up to 14 days, this is for patients being evaluated for multiple types heart rhythms. For the first 24 hours post application, please avoid getting the Zio monitor wet in the shower or by excessive sweating during exercise. After that, feel free to carry on with regular activities. Keep soaps and lotions away from the ZIO XT Patch.  This will be mailed to you, please expect 7-10 days to receive.  placed at our [facility] location - [address].        Follow-Up: At [facility], you and your health needs are our priority.  As part of our continuing mission to provide you with exceptional heart care, we have created designated Provider Care Teams.  These Care Teams include your primary Cardiologist (physician) and Advanced Practice Providers (APPs -  Physician Assistants and Nurse Practitioners) who all work together to provide you with the care you need, when you need it.  Your next appointment:   6 WEEKS  The format for your next appointment:   In Person  Provider:   Eleonore Chiquito, MD  Other Instructions

## 2019-02-16 NOTE — Addendum Note (Signed)
Addended by: Rita Ohara on: 02/16/2019 08:08 PM   Modules accepted: Orders

## 2019-02-17 NOTE — Addendum Note (Signed)
Addended by: Raiford Simmonds on: 02/17/2019 08:36 AM   Modules accepted: Orders

## 2019-02-22 ENCOUNTER — Other Ambulatory Visit: Payer: Self-pay

## 2019-02-22 ENCOUNTER — Ambulatory Visit (HOSPITAL_COMMUNITY): Payer: 59 | Attending: Cardiovascular Disease

## 2019-02-22 DIAGNOSIS — R002 Palpitations: Secondary | ICD-10-CM

## 2019-02-22 MED ORDER — PERFLUTREN LIPID MICROSPHERE
1.0000 mL | INTRAVENOUS | Status: AC | PRN
  Administered 2019-02-22: 2 mL via INTRAVENOUS

## 2019-02-23 ENCOUNTER — Telehealth: Payer: Self-pay | Admitting: *Deleted

## 2019-02-23 NOTE — Telephone Encounter (Signed)
7 day ZIO XT long term holter monitor to be mailed to the patients home.  Instructions reviewed briefly as they are included in the monitor kit. 

## 2019-03-03 ENCOUNTER — Ambulatory Visit (INDEPENDENT_AMBULATORY_CARE_PROVIDER_SITE_OTHER): Payer: 59

## 2019-03-03 DIAGNOSIS — R002 Palpitations: Secondary | ICD-10-CM

## 2019-03-27 NOTE — Progress Notes (Deleted)
Cardiology Office Note:   Date:  03/27/2019  NAME:  Laura Payne    MRN: [id number] DOB:  07-03-1955   PCP:  Rita Ohara, MD  Cardiologist:  Evalina Field, MD  Electrophysiologist:  None   Referring MD: Rita Ohara, MD   No chief complaint on file. ***  History of Present Illness:   Laura Payne is a 63 y.o. female with a hx of hypertension who presents for follow-up of palpitations. TTE normal and monitor with brief ectopic atrial tachycardia.   Past Medical History: Past Medical History:  Diagnosis Date  . Allergy   . Dyslipidemia    hypercholesterolemia  . GERD (gastroesophageal reflux disease)   . Goiter    u/s 07/2004, generalized enlargement without cysts or nodules; normal TSH  . 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 Hx gestational diabetes   . Hypertension   . Lipoma of axilla    h/o left  . Low sodium levels    history of low sodium with diuretics in the past.    Past Surgical History: Past Surgical History:  Procedure Laterality Date  . TONSILLECTOMY      Current Medications: No outpatient medications have been marked as taking for the 03/31/19 encounter (Appointment) with O'Neal, Cassie Freer, MD.     Allergies:    Codeine, Hctz [hydrochlorothiazide], Latex, and Penicillins   Social History: Social History   Socioeconomic History  . Marital status: Divorced    Spouse name: Not on file  . Number of children: 1  . Years of education: Not on file  . Highest education level: Not on file  Occupational History  . Occupation: Nurse, children's: [employer]  . Financial resource strain: Not on file  . Food insecurity    Worry: Not on file    Inability: Not on file  . Transportation needs    Medical: Not on file    Non-medical: Not on file  Tobacco Use  . Smoking status: Former Smoker    Packs/day: 0.10    Years: 41.00    Pack years: 4.10    Types: Cigarettes   Quit date: 10/07/2013    Years since quitting: 5.4  . Smokeless tobacco: Never Used  Substance and Sexual Activity  . Alcohol use: Yes    Alcohol/week: 0.0 standard drinks    Comment: 2 glasses of wine every evening (12 ounce total)  . Drug use: No  . Sexual activity: Yes  Lifestyle  . Physical activity    Days per week: Not on file    Minutes per session: Not on file  . Stress: Not on file  Relationships  . Social Herbalist on phone: Not on file    Gets together: Not on file    Attends religious service: Not on file    Active member of club or organization: Not on file    Attends meetings of clubs or organizations: Not on file    Relationship status: Not on file  Other Topics Concern  . Not on file  Social History Narrative   Lives with her son. Son works for her (did not finish Water quality scientist). no pets.   Architect. 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 Monogamous, committed relationship     Family History: The patient's ***family history includes Arthritis in her cousin; Atrial fibrillation  in her mother; Breast cancer (age of onset: 69) in her paternal grandmother; Cancer in her maternal grandfather and mother; Cancer (age of onset: 47) in her father; Cancer (age of onset: 10) in her paternal uncle; Crohn's disease in her cousin; Dementia in her mother; Diabetes in her cousin and maternal uncle; Heart disease in her mother; Hypertension in her mother; Osteoporosis in her mother; Ovarian cancer in her mother; Psoriasis in her cousin; Transient ischemic attack in her mother.  ROS:   All other ROS reviewed and negative. Pertinent positives noted in the HPI.     EKGs/Labs/Other Studies Reviewed:   The following studies were personally reviewed by me today:  EKG:  EKG is *** ordered today.  The ekg ordered today demonstrates ***, and was personally reviewed by me.   Zio 03/22/2019 Patient had a min HR of 55 bpm (sinus  bradycardia), max HR of 184 bpm (supraventricular tachycardia ~4 beat duration), and avg HR of 83 bpm (normal sinus rhythm). Predominant underlying rhythm was Sinus Rhythm. 5 Supraventricular Tachycardia runs occurred, the run with the fastest interval lasting 4 beats with a max rate of 184 bpm, the longest lasting 8 beats (supraventricular tachycardia) with an avg rate of 125 bpm. SVEs were rare (<1.0%), SVE Couplets were rare (<1.0%), and SVE Triplets were rare (<1.0%). Isolated VEs were rare (<1.0%), VE Couplets were rare (<1.0%), and no VE Triplets were present. No atrial fibrillation detected.   Patient diary events summarized below:  03/03/2019 12:25 PM: Symptoms of flutter/racing/pounding coincided with sinus tachycardia 118 bpm.  03/04/2019 10:39 AM: Symptoms of anxious, pounding, shortness of breath coincided with sinus tachycardia 105 bpm.  03/04/2019 3:05 PM: Symptoms of flutter/racing/skipped beats coincided with sinus rhythm 93 bpm.  03/05/2019 2:25 PM: Symptoms of flutter/racing/pounding coincided with sinus tachycardia 113 bpm.  03/08/2019 9:45 AM: Symptoms of flutter/racing/light headed coincided with sinus tachycardia 100 bpm.  03/09/2019 12:53 PM: Symptoms of chest pain/pressure/fluttering/racing/pounding/shortness of breath/headache coincided with sinus tachycardia 105 bpm.   Impression:  1. 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  2. Rare ectopy.  3. No atrial fibrillation.   TTE 02/22/2019  1. Left ventricular ejection fraction, by visual estimation, is 60 to 65%. The left ventricle has normal function. There is mildly increased left ventricular hypertrophy.  2. Global right ventricle has normal systolic function.The right ventricular size is normal. No increase in right ventricular wall thickness.  3. Left atrial size was normal.  4. Right atrial size was normal.  5. The mitral valve is normal in structure. No  evidence of mitral valve regurgitation. No evidence of mitral stenosis.  6. The tricuspid valve is normal in structure. Tricuspid valve regurgitation is not demonstrated.  7. The aortic valve is tricuspid. Aortic valve regurgitation is not visualized. Mild aortic valve sclerosis without stenosis.  8. The pulmonic valve was normal in structure. Pulmonic valve regurgitation is not visualized.  9. The inferior vena cava is normal in size with greater than 50% respiratory variability, suggesting right atrial pressure of 3 mmHg. 10. The interatrial septum was not well visualized.  Recent Labs: 11/08/2018: BUN 13; Creatinine, Ser 0.82; Potassium 4.8; Sodium 142 02/15/2019: ALT 33; TSH 1.030   Recent Lipid Panel    Component Value Date/Time   CHOL 206 (H) 02/15/2019 1045   TRIG 245 (H) 02/15/2019 1045   HDL 60 02/15/2019 1045   CHOLHDL 3.4 02/15/2019 1045   CHOLHDL 3.0 11/03/2016 1230   VLDL 35 (  H) 11/03/2016 1230   LDLCALC 104 (H) 02/15/2019 1045    Physical Exam:   VS:  There were no vitals taken for this visit.   Wt Readings from Last 3 Encounters:  02/16/19 177 lb 6.4 oz (80.5 kg)  02/15/19 176 lb (79.8 kg)  11/09/18 174 lb (78.9 kg)    General: Well nourished, well developed, in no acute distress Heart: Atraumatic, normal size  Eyes: PEERLA, EOMI  Neck: Supple, no JVD Endocrine: No thryomegaly Cardiac: Normal S1, S2; RRR; no murmurs, rubs, or gallops Lungs: Clear to auscultation bilaterally, no wheezing, rhonchi or rales  Abd: Soft, nontender, no hepatomegaly  Ext: No edema, pulses 2+ Musculoskeletal: No deformities, BUE and BLE strength normal and equal Skin: Warm and dry, no rashes   Neuro: Alert and oriented to person, place, time, and situation, CNII-XII grossly intact, no focal deficits  Psych: Normal mood and affect   ASSESSMENT:   Laura Payne is a 64 y.o. female who presents for the following: No diagnosis found.  PLAN:   There are no diagnoses linked to this  encounter.  Disposition: No follow-ups on file.  Medication Adjustments/Labs and Tests Ordered: Current medicines are reviewed at length with the patient today.  Concerns regarding medicines are outlined above.  No orders of the defined types were placed in this encounter.  No orders of the defined types were placed in this encounter.   There are no Patient Instructions on file for this visit.   Signed, Addison Naegeli. Audie Box, [place]  [address], [place], [place] [address] [phone]  03/27/2019 1:29 PM

## 2019-03-30 NOTE — Progress Notes (Signed)
Virtual Visit via Video Note   This visit type was conducted due to national recommendations for restrictions regarding the COVID-19 Pandemic (e.g. social distancing) in an effort to limit this patient's exposure and mitigate transmission in our community.  Due to her co-morbid illnesses, this patient is at least at moderate risk for complications without adequate follow up.  This format is felt to be most appropriate for this patient at this time.  All issues noted in this document were discussed and addressed.  A limited physical exam was performed with this format.  Please refer to the patient's chart for her consent to telehealth for [facility].   Date:  03/31/2019   ID:  Laura Payne, DOB 29-Jun-1955, MRN [id number]  Patient Location: Home Provider Location: Office  PCP:  Rita Ohara, MD  Cardiologist:  Evalina Field, MD   Evaluation Performed:  Follow-Up Visit  Chief Complaint: Palpitations  History of Present Illness:    Laura Payne is a 63 y.o. female with history of hyperlipidemia, hypertension who presents for follow-up of palpitations.  Recent echocardiogram was normal.  Recent Zio patch showed brief SVT episodes that lasted 4-6 beats.  No sustained arrhythmias noted.  She reports that her symptoms seem to resolve around excess caffeine consumption.  Her monitor simply demonstrates sinus tachycardia at times.  She reports she is down to 1 cup of coffee per day and her symptoms have resolved.  She also reports she is cut down her alcohol use.  Due to the coronavirus pandemic she was consuming excessive wine at night.  She is cut back on this.  She has started exercise but this is an the plan as well.  She states she overall feels much better after reducing her caffeine consumption.  And she is alleviated to noted that her symptoms are not arrhythmia related.  Review of her laboratory data shows blood cholesterol level not quite optimal.  She is hopeful for diet exercise  and to recheck this in the next few months.  The patient does not have symptoms concerning for COVID-19 infection (fever, chills, cough, or new shortness of breath).    Past Medical History:  Diagnosis Date  . Allergy   . Dyslipidemia    hypercholesterolemia  . GERD (gastroesophageal reflux disease)   . Goiter    u/s 07/2004, generalized enlargement without cysts or nodules; normal TSH  . 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 Hx gestational diabetes   . Hypertension   . Lipoma of axilla    h/o left  . Low sodium levels    history of low sodium with diuretics in the past.   Past Surgical History:  Procedure Laterality Date  . TONSILLECTOMY       Current Meds  Medication Sig  . aspirin 81 MG tablet Take 81 mg by mouth daily.   . Cholecalciferol (VITAMIN D) 2000 units CAPS Take 1 capsule by mouth.  . Coenzyme Q10 (COQ10 PO) Take 1 capsule by mouth daily.  Marland Kitchen esomeprazole (NEXIUM) 20 MG capsule Take 20 mg by mouth daily at 12 noon.   Marland Kitchen ibuprofen (ADVIL,MOTRIN) 200 MG tablet Take 800 mg by mouth every 6 (six) hours as needed.  Marland Kitchen lisinopril (ZESTRIL) 40 MG tablet Take 1 tablet (40 mg total) by mouth daily.  . Multiple Vitamins-Minerals (WOMENS 50+ MULTI VITAMIN/MIN) TABS Take 1 tablet by mouth daily.  . rosuvastatin (CRESTOR) 10 MG tablet  TAKE 1 TABLET(10 MG) BY MOUTH DAILY  . TURMERIC PO Take 2 tablets by mouth daily.     Allergies:   Codeine, Hctz [hydrochlorothiazide], Latex, and Penicillins   Social History   Tobacco Use  . Smoking status: Former Smoker    Packs/day: 0.10    Years: 41.00    Pack years: 4.10    Types: Cigarettes    Quit date: 10/07/2013    Years since quitting: 5.4  . Smokeless tobacco: Never Used  Substance Use Topics  . Alcohol use: Yes    Alcohol/week: 0.0 standard drinks    Comment: 2 glasses of wine every evening (12 ounce total)  . Drug use: No     Family Hx: The patient's family history  includes Arthritis in her cousin; Atrial fibrillation in her mother; Breast cancer (age of onset: 46) in her paternal grandmother; Cancer in her maternal grandfather and mother; Cancer (age of onset: 50) in her father; Cancer (age of onset: 23) in her paternal uncle; Crohn's disease in her cousin; Dementia in her mother; Diabetes in her cousin and maternal uncle; Heart disease in her mother; Hypertension in her mother; Osteoporosis in her mother; Ovarian cancer in her mother; Psoriasis in her cousin; Transient ischemic attack in her mother.  ROS:   Please see the history of present illness.     All other systems reviewed and are negative.   Prior CV studies:   The following studies were reviewed today:  Zio Patch 03/27/2019 Patient had a min HR of 55 bpm (sinus bradycardia), max HR of 184 bpm (supraventricular tachycardia ~4 beat duration), and avg HR of 83 bpm (normal sinus rhythm). Predominant underlying rhythm was Sinus Rhythm. 5 Supraventricular Tachycardia runs occurred, the run with the fastest interval lasting 4 beats with a max rate of 184 bpm, the longest lasting 8 beats (supraventricular tachycardia) with an avg rate of 125 bpm. SVEs were rare (<1.0%), SVE Couplets were rare (<1.0%), and SVE Triplets were rare (<1.0%). Isolated VEs were rare (<1.0%), VE Couplets were rare (<1.0%), and no VE Triplets were present. No atrial fibrillation detected.   Patient diary events summarized below:  03/03/2019 12:25 PM: Symptoms of flutter/racing/pounding coincided with sinus tachycardia 118 bpm.  03/04/2019 10:39 AM: Symptoms of anxious, pounding, shortness of breath coincided with sinus tachycardia 105 bpm.  03/04/2019 3:05 PM: Symptoms of flutter/racing/skipped beats coincided with sinus rhythm 93 bpm.  03/05/2019 2:25 PM: Symptoms of flutter/racing/pounding coincided with sinus tachycardia 113 bpm.  03/08/2019 9:45 AM: Symptoms of flutter/racing/light headed coincided with sinus tachycardia 100  bpm.  03/09/2019 12:53 PM: Symptoms of chest pain/pressure/fluttering/racing/pounding/shortness of breath/headache coincided with sinus tachycardia 105 bpm.   Impression:  1. 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  2. Rare ectopy.  3. No atrial fibrillation.    TTE 02/22/2019  1. Left ventricular ejection fraction, by visual estimation, is 60 to 65%. The left ventricle has normal function. There is mildly increased left ventricular hypertrophy.  2. Global right ventricle has normal systolic function.The right ventricular size is normal. No increase in right ventricular wall thickness.  3. Left atrial size was normal.  4. Right atrial size was normal.  5. The mitral valve is normal in structure. No evidence of mitral valve regurgitation. No evidence of mitral stenosis.  6. The tricuspid valve is normal in structure. Tricuspid valve regurgitation is not demonstrated.  7. The aortic valve is tricuspid. Aortic valve regurgitation is not visualized. Mild  aortic valve sclerosis without stenosis.  8. The pulmonic valve was normal in structure. Pulmonic valve regurgitation is not visualized.  9. The inferior vena cava is normal in size with greater than 50% respiratory variability, suggesting right atrial pressure of 3 mmHg. 10. The interatrial septum was not well visualized.  Labs/Other Tests and Data Reviewed:    EKG:  No ECG reviewed.  Recent Labs: 11/08/2018: BUN 13; Creatinine, Ser 0.82; Potassium 4.8; Sodium 142 02/15/2019: ALT 33; TSH 1.030   Recent Lipid Panel Lab Results  Component Value Date/Time   CHOL 206 (H) 02/15/2019 10:45 AM   TRIG 245 (H) 02/15/2019 10:45 AM   HDL 60 02/15/2019 10:45 AM   CHOLHDL 3.4 02/15/2019 10:45 AM   CHOLHDL 3.0 11/03/2016 12:30 PM   LDLCALC 104 (H) 02/15/2019 10:45 AM    Wt Readings from Last 3 Encounters:  03/31/19 177 lb (80.3 kg)  02/16/19 177 lb 6.4 oz (80.5 kg)  02/15/19 176 lb  (79.8 kg)     Objective:    Vital Signs:  BP 128/85   Pulse 83   Ht 5\' 5"  (1.651 m)   Wt 177 lb (80.3 kg)   BMI 29.45 kg/m    VITAL SIGNS:  reviewed   No physical exam was performed as this was a video virtual visit  ASSESSMENT & PLAN:    1. Palpitations -Echo normal.  Monitor just demonstrated sinus tachycardia.  Symptoms appear to be related to excess caffeine consumption.  Her symptoms are basically resolved with reduction in caffeine consumption.  We will just continue to monitor this for now.  I will have her follow-up in 6 months just to make sure symptoms do not recur.  She was relieved to know she did not have atrial fibrillation.  2. Essential hypertension -Managed by primary care physician no change in management  3. Mixed hyperlipidemia -Managed by primary care physician no change in management   COVID-19 Education: The signs and symptoms of COVID-19 were discussed with the patient and how to seek care for testing (follow up with PCP or arrange E-visit).  The importance of social distancing was discussed today.  Time:   Today, I have spent 25 minutes with the patient with telehealth technology discussing the above problems.     Medication Adjustments/Labs and Tests Ordered: Current medicines are reviewed at length with the patient today.  Concerns regarding medicines are outlined above.   Tests Ordered: No orders of the defined types were placed in this encounter.   Medication Changes: No orders of the defined types were placed in this encounter.   Follow Up:  Virtual Visit  in 6 month(s)  Signed, Evalina Field, MD  03/31/2019 9:47 AM    [place]

## 2019-03-31 ENCOUNTER — Encounter: Payer: Self-pay | Admitting: Cardiovascular Disease

## 2019-03-31 ENCOUNTER — Ambulatory Visit: Payer: 59 | Admitting: Cardiovascular Disease

## 2019-03-31 ENCOUNTER — Telehealth (INDEPENDENT_AMBULATORY_CARE_PROVIDER_SITE_OTHER): Payer: 59 | Admitting: Cardiovascular Disease

## 2019-03-31 VITALS — BP 128/85 | HR 83 | Ht 65.0 in | Wt 177.0 lb

## 2019-03-31 DIAGNOSIS — E782 Mixed hyperlipidemia: Secondary | ICD-10-CM

## 2019-03-31 DIAGNOSIS — I10 Essential (primary) hypertension: Secondary | ICD-10-CM

## 2019-03-31 DIAGNOSIS — R002 Palpitations: Secondary | ICD-10-CM | POA: Diagnosis not present

## 2019-03-31 NOTE — Patient Instructions (Signed)
Your physician recommends that you continue on your current medications as directed. Please refer to the Current Medication list given to you today.  Your physician wants you to follow-up in: [place] will receive a reminder letter in the mail two months in advance. If you don't receive a letter, please call our office to schedule the follow-up appointment.

## 2019-06-28 ENCOUNTER — Ambulatory Visit: Payer: 59 | Admitting: Family Medicine

## 2019-07-13 ENCOUNTER — Encounter: Payer: Self-pay | Admitting: *Deleted

## 2019-07-15 ENCOUNTER — Other Ambulatory Visit: Payer: Self-pay | Admitting: Family Medicine

## 2019-07-15 DIAGNOSIS — E782 Mixed hyperlipidemia: Secondary | ICD-10-CM

## 2019-07-24 ENCOUNTER — Other Ambulatory Visit: Payer: Self-pay | Admitting: Family Medicine

## 2019-07-24 DIAGNOSIS — E782 Mixed hyperlipidemia: Secondary | ICD-10-CM

## 2019-07-25 ENCOUNTER — Other Ambulatory Visit: Payer: Self-pay | Admitting: Family Medicine

## 2019-07-25 DIAGNOSIS — E782 Mixed hyperlipidemia: Secondary | ICD-10-CM

## 2019-08-15 NOTE — Patient Instructions (Addendum)
HEALTH MAINTENANCE RECOMMENDATIONS:  It is recommended that you get at least 30 minutes of aerobic exercise at least 5 days/week (for weight loss, you may need as much as 60-90 minutes). This can be any activity that gets your heart rate up. This can be divided in 10-15 minute intervals if needed, but try and build up your endurance at least once a week.  Weight bearing exercise is also recommended twice weekly.  Eat a healthy diet with lots of vegetables, fruits and fiber.  "Colorful" foods have a lot of vitamins (ie green vegetables, tomatoes, red peppers, etc).  Limit sweet tea, regular sodas and alcoholic beverages, all of which has a lot of calories and sugar.  Up to 1 alcoholic drink daily may be beneficial for women (unless trying to lose weight, watch sugars).  Drink a lot of water.  Calcium recommendations are 1200-1500 mg daily (1500 mg for postmenopausal women or women without ovaries), and vitamin D 1000 IU daily.  This should be obtained from diet and/or supplements (vitamins), and calcium should not be taken all at once, but in divided doses.  Monthly self breast exams and yearly mammograms for women over the age of 57 is recommended.  Sunscreen of at least SPF 30 should be used on all sun-exposed parts of the skin when outside between the hours of 10 am and 4 pm (not just when at beach or pool, but even with exercise, golf, tennis, and yard work!)  Use a sunscreen that says "broad spectrum" so it covers both UVA and UVB rays, and make sure to reapply every 1-2 hours.  Remember to change the batteries in your smoke detectors when changing your clock times in the spring and fall. Carbon monoxide detectors are recommended for your home.  Use your seat belt every time you are in a car, and please drive safely and not be distracted with cell phones and texting while driving.  I recommend getting the new shingles vaccine (Shingrix). You will need to check with your insurance to see if it  is covered, and if covered, schedule a nurse visit at our office when convenient.  It is a series of 2 injections, spaced 2 months apart.  Start using the Flonase sprays regularly, 2 sprays into each nostril. It can take up to 10 days to see the full effect. Continue throughout allergy season. Use gentle sniffs.  Please work on cutting back or eliminating alcohol, as we discussed.  Recommendation is for 1 glass/day, but likely would be easier for you to just stop having it around.  Consider only having wine when in restaurants, max 2 drinks.  I do suspect that you have sleep apnea, and would benefit from a formal sleep study, mainly to ensure that you are on the proper treatment and settings (CPAP vs Bipap).  You should wait another 2 weeks before getting your mammogram (6 weeks after your last COVID vaccine).  Apply warm compresses to the hematoma at the R breast   Hematoma A hematoma is a collection of blood under the skin, in an organ, in a body space, in a joint space, or in other tissue. The blood can thicken (clot) to form a lump that you can see and feel. The lump is often firm and may become sore and tender. Most hematomas get better in a few days to weeks. However, some hematomas may be serious and require medical care. Hematomas can range from very small to very large. What are the causes? This  condition is caused by:  A blunt or penetrating injury.  A leakage from a blood vessel under the skin.  Some medical procedures, including surgeries, such as oral surgery, face lifts, and surgeries on the joints.  Some medical conditions that cause bleeding or bruising. There may be multiple hematomas that appear in different areas of the body. What increases the risk? You are more likely to develop this condition if:  You are an older adult.  You use blood thinners. What are the signs or symptoms?  Symptoms of this condition depend on where the hematoma is located.  Common  symptoms of a hematoma that is under the skin include:  A firm lump on the body.  Pain and tenderness in the area.  Bruising. Blue, dark blue, purple-red, or yellowish skin (discoloration) may appear at the site of the hematoma if the hematoma is close to the surface of the skin. Common symptoms of a hematoma that is deep in the tissues or body spaces may be less obvious. They include:  A collection of blood in the stomach (intra-abdominal hematoma). This may cause pain in the abdomen, weakness, fainting, and shortness of breath.  A collection of blood in the head (intracranial hematoma). This may cause a headache or symptoms such as weakness, trouble speaking or understanding, or a change in consciousness. How is this diagnosed? This condition is diagnosed based on:  Your medical history.  A physical exam.  Imaging tests, such as an ultrasound or CT scan. These may be needed if your health care provider suspects a hematoma in deeper tissues or body spaces.  Blood tests. These may be needed if your health care provider believes that the hematoma is caused by a medical condition. How is this treated? Treatment for this condition depends on the cause, size, and location of the hematoma. Treatment may include:  Doing nothing. The majority of hematomas do not need treatment as many of them go away on their own over time.  Surgery or close monitoring. This may be needed for large hematomas or hematomas that affect vital organs.  Medicines. Medicines may be given if there is an underlying medical cause for the hematoma. Follow these instructions at home: Managing pain, stiffness, and swelling   If directed, put ice on the affected area. ? Put ice in a plastic bag. ? Place a towel between your skin and the bag. ? Leave the ice on for 20 minutes, 2-3 times a day for the first couple of days.  If directed, apply heat to the affected area after applying ice for a couple of days. Use the  heat source that your health care provider recommends, such as a moist heat pack or a heating pad. ? Place a towel between your skin and the heat source. ? Leave the heat on for 20-30 minutes. ? Remove the heat if your skin turns bright red. This is especially important if you are unable to feel pain, heat, or cold. You may have a greater risk of getting burned.  Raise (elevate) the affected area above the level of your heart while you are sitting or lying down.  If told, wrap the affected area with an elastic bandage. The bandage applies pressure (compression) to the area, which may help to reduce swelling and promote healing. Do not wrap the bandage too tightly around the affected area.  If your hematoma is on a leg or foot (lower extremity) and is painful, your health care provider may recommend crutches. Use  them as told by your health care provider. General instructions  Take over-the-counter and prescription medicines only as told by your health care provider.  Keep all follow-up visits as told by your health care provider. This is important. Contact a health care provider if:  You have a fever.  The swelling or discoloration gets worse.  You develop more hematomas. Get help right away if:  Your pain is worse or your pain is not controlled with medicine.  Your skin over the hematoma breaks or starts bleeding.  Your hematoma is in your chest or abdomen and you have weakness, shortness of breath, or a change in consciousness.  You have a hematoma on your scalp that is caused by a fall or injury, and you also have: ? A headache that gets worse. ? Trouble speaking or understanding speech. ? Weakness. ? Change in alertness or consciousness. Summary  A hematoma is a collection of blood under the skin, in an organ, in a body space, in a joint space, or in other tissue.  This condition usually does not need treatment because many hematomas go away on their own over time.  Large  hematomas, or those that may affect vital organs, may need surgical drainage or monitoring. If the hematoma is caused by a medical condition, medicines may be prescribed.  Get help right away if your hematoma breaks or starts to bleed, you have shortness of breath, or you have a headache or trouble speaking after a fall. This information is not intended to replace advice given to you by your health care provider. Make sure you discuss any questions you have with your health care provider. Document Revised: 08/31/2018 Document Reviewed: 09/09/2017 Elsevier Patient Education  2020 [employer].

## 2019-08-15 NOTE — Progress Notes (Signed)
Chief Complaint  Patient presents with  . Annual Exam    fasting annual exam. Sees eye doctor. Dropped can of food on her right breast about 10 days ago and would like you to look at. Is going to scheduled mammo and annual/pap with Dr. Julien Girt now that she is fully vaccinated. Ok for shingrix, but would like to do ona Friday, if possible.    Laura Payne is a 64 y.o. female who presents for a complete physical.  She has the following concerns:  She would like her R chest looked at.  She dropped a heavy can directly onto the upper part of her right breast about 10 days ago.  She has developed a hematoma. Bruising is improving, as is discomfort, but lump is still there.   Hypertension:  She is compliant with lisinopril 102m daily, and denies side effects. She has some cough in the mornings related to allergies/PND. BP's have been running always <135/85, usually upper 120's/upper 70's. She denies dizziness, chest pain, shortness of breath, edema. She saw cardiologist for palpitations (she was worried she could have afib like her mother). She had normal echo, and heart monitor that showed sinus tachycardia (that correlated with her symptoms, only very short runs, 8 beats or less, of SVT). She found that her symptoms were related to caffeine intake, and improved when she decreased to 1 cup/day. Currently has 1 cup and 1/2 diet Coke daily.   She had also cut back on her alcohol intake, but is up slightly since that time; no longer drinking nightly, but 2-3 glasses every other day.  She gets sinus headaches, has chronic allergies and congestion.  She is using loratidine without much benefit. Using nasal saline (spray, not sinus rinses).  Not currently using a steroid spray (used sporadically in the past without benefit, still has some at home).  Using Bipap machine and finds she is very dry when she wakes up in the morning.  She puts distilled water in it, but still feels dry by morning.  She never had  sleep study or any assessment for sleep apnea. She said she has been using her boyfriend's extra machine for many years, started with CPAP, but most recently has been using bipap.   She reports that she snores when she doesn't use the machine. When using it, she feels refreshed when she wakes up . She reports she has been using it for 10 years!    Hyperlipidemia: Sheis takingCrestor,tolerating this well without problems,whiletaking along with coenzyme Q10. Her TG remained elevated on last check, and she was encouraged to start omega-3 fish oil, as well as cut back on alcohol (she had already cut back to 2 glasses/night at that time, had been up to 3-4 glasses at one point during the pandemic). She is not currently taking any fish oil (pills were large).  Currently is drinking 2-3 glasses of wine every other day.  Lab Results  Component Value Date   CHOL 206 (H) 02/15/2019   HDL 60 02/15/2019   LDLCALC 104 (H) 02/15/2019   TRIG 245 (H) 02/15/2019   CHOLHDL 3.4 02/15/2019    Impaired fasting glucose: H/o GDM. Tries to limit carbs/sweets/sugar. She does, except for the wine.  A1c's and fasting sugars had been normal,until 02/2018, fasting sugar 117, (prior elevated sugar was 2014). Fasting glucose was 111 in 01/2019. She is fasting for labs today. Lab Results  Component Value Date   HGBA1C 5.6 02/15/2019   Vitamin D deficiency:Last level was34.6  in 02/2018, when taking 2000 IU of gummy vitamin D3 daily (along with gummy MVI). Previously had been low, with level of 20 in 08/2016; prior to that was11 in November, 2017.Shecontinues to take 2000 IU daily.  GERD: She has been takingNexium OTCwith good results,needs to take it daily. She has recurrent symptoms with missed doses.(previously took rx doses, no longer needed). Avoids carbs, bagels, as it triggers reflux, dysphagia.  No longer having issues with dysphagia (only with large pills). She had a flare with trouble  swallowing in the last year, but it has since gotten back to baseline.   Immunization History  Administered Date(s) Administered  . Influenza Split 05/04/2011  . Influenza,inj,Quad PF,6+ Mos 04/05/2013, 05/07/2014, 02/14/2015, 02/27/2016, 02/24/2018, 02/15/2019  . PFIZER SARS-COV-2 Vaccination 06/23/2019, 07/14/2019  . Tdap 10/19/2007, 02/15/2019   Last Pap smear: with Dr. Julien Girt, in 2019 per pt (no records received). She reports she also had normal genetic testing. We have no records from her office. Last mammogram: 09/2017 at Dr. Julien Girt' office Last colonoscopy: age 89 (11/2007, Dr. Oletta Lamas); had Cologuard 10/2018 Last DEXA: reportedly in 2019, normal (at Dr. Julien Girt office) Dentist: twice yearly Ophtho: yearly Exercise: Walks while on the phone at work. Goes to the beach on the weekends since 02/2019, walks on the beach, 1 hour 4 days/week at the beach, every week.   No weight-bearing exercising.   PMH, PSH, SH and FH were reviewed and updated  Outpatient Encounter Medications as of 08/16/2019  Medication Sig Note  . aspirin 81 MG tablet Take 81 mg by mouth daily.    . Cholecalciferol (VITAMIN D) 2000 units CAPS Take 1 capsule by mouth. 02/24/2018: daily  . Coenzyme Q10 (COQ10 PO) Take 1 capsule by mouth daily.   Marland Kitchen esomeprazole (NEXIUM) 20 MG capsule Take 20 mg by mouth daily at 12 noon.    Marland Kitchen lisinopril (ZESTRIL) 40 MG tablet Take 1 tablet (40 mg total) by mouth daily.   . Multiple Vitamins-Minerals (WOMENS 50+ MULTI VITAMIN/MIN) TABS Take 1 tablet by mouth daily.   . rosuvastatin (CRESTOR) 10 MG tablet TAKE 1 TABLET(10 MG) BY MOUTH DAILY   . ibuprofen (ADVIL,MOTRIN) 200 MG tablet Take 800 mg by mouth every 6 (six) hours as needed.   . TURMERIC PO Take 2 tablets by mouth daily. 02/15/2019: Too big of a pill to swallow   No facility-administered encounter medications on file as of 08/16/2019.   Allergies  Allergen Reactions  . Codeine Other (See Comments)    Would make her start  her period. Also just affected her body poorly so she just does not take it.  Marland Kitchen Hctz [Hydrochlorothiazide] Other (See Comments)    Hyponatremia   . Latex Rash  . Penicillins Rash    ROS: The patient denies anorexia, weight changes,vision changes, ear pain, breast concerns (just recent injury), chest pain, dizziness, syncope, dyspnea on exertion, swelling, nausea, vomiting, diarrhea, constipation, abdominal pain, melena, hematochezia, hematuria, incontinence, dysuria, vaginal bleeding, discharge, odor or itch, genital lesions, weakness, tremor, suspicious skin lesions, depression, anxiety, abnormal bleeding/bruising, or enlarged lymph nodes.  +hot flashes, mild, tolerable night sweats. Left hip/buttock pain in the mornings, relieved by stretches (comes and goes, unchanged) Controlled reflux, no dysphagia, see HPI.  Tingling LLE, used to just be left thigh, spread down to the foot (since she tore hamstring 03/2016); denies leg weakness Palpitations resolved. Allergies and congestion, per HPI, sinus headaches. She has had some dental problems recently, got crowns. +snoring and unrefreshed sleep when not using either  CPAP/BiPAP   PHYSICAL EXAM:  BP 124/72   Pulse 84   Temp (!) 97.3 F (36.3 C) (Other (Comment))   Ht '5\' 5"'  (1.651 m)   Wt 179 lb 9.6 oz (81.5 kg)   BMI 29.89 kg/m   Wt Readings from Last 3 Encounters:  08/16/19 179 lb 9.6 oz (81.5 kg)  03/31/19 177 lb (80.3 kg)  02/16/19 177 lb 6.4 oz (80.5 kg)    General Appearance:   Alert, cooperative, no distress, appears stated age  Head:   Normocephalic, without obvious abnormality, atraumatic  Eyes:   PERRL, conjunctiva/corneas clear, EOM's intact, fundi benign  Ears:   Normal TM's and external ear canals  Nose:   Not examined, wearing mask due to COVID-19 pandemic  Throat:  Not examined, wearing mask due to COVID-19 pandemicl  Neck:  Supple, no lymphadenopathy; thyroid: nosignificant  enlargement/tenderness/nodules; no carotidbruit or JVD  Back:  Spine nontender, no curvature, ROM normal, no CVA tenderness  Lungs:   Clear to auscultation bilaterally without wheezes, rales or ronchi; respirations unlabored  Chest Wall:   2 x 2 cm firm area just above the residual portion of large bruise (had been in the center of the bruised area--top portion has resolved, minimally yellow residual, lower portion still has some purple-green).  No overlying warmth, no fluctuance.  Heart:   Regular rate and rhythm, S1 and S2 normal, no murmur, rub or gallop  Breast Exam:   Deferred to GYN  Abdomen:   Soft, non-tender, nondistended, normoactive bowel sounds, no masses, no hepatosplenomegaly  Genitalia:   Deferred to GYN     Extremities:  No clubbing, cyanosis or edema  Pulses:  2+ and symmetric all extremities  Skin:  Skin color, texture, turgor normal, no rashes or lesions. Large resolving ecchymosis at R upper breast as described in chest wall. Scattered cherry angiomas  Lymph nodes:  Cervical, supraclavicular, and axillary nodes normal  Neurologic:  CNII-XII intact, normal strength, sensation and gait; reflexes 2+ and symmetric.   Psych: Normal mood, affect, hygiene and grooming   ASSESSMENT/PLAN:  Annual physical exam - Due to see GYN, will schedule; will try and get results (had DEXA and pap done 2019 per pt) - Plan: Lipid panel, Comprehensive metabolic panel, CBC with Differential/Platelet, VITAMIN D 25 Hydroxy (Vit-D Deficiency, Fractures), Hemoglobin A1c  Essential hypertension, benign - controlled - Plan: Comprehensive metabolic panel  Impaired fasting glucose - counseled re: diet, exercise, weight loss recommended - Plan: Comprehensive metabolic panel, Hemoglobin A1c  Mixed hyperlipidemia - suspect TG to still be elevated--not taking fish oil, ETOH intake about the same. omega-3 and decrease in wine recommended - Plan:  Lipid panel  Allergic rhinitis, unspecified seasonality, unspecified trigger - encouraged regular use of Flonase, and instructed on proper technique.  Vitamin D deficiency - Plan: VITAMIN D 25 Hydroxy (Vit-D Deficiency, Fractures)  Palpitations - resolved with decrease in caffeine intake  Suspected sleep apnea - she has been self-treating with her SO's CPAP and now BiPAP machines, and feels better. Declines proper eval/assessment  Medication monitoring encounter - Plan: Lipid panel, Comprehensive metabolic panel, VITAMIN D 25 Hydroxy (Vit-D Deficiency, Fractures)  Hematoma of right chest wall, initial encounter - encouraged warm compresses. Discussed normal healing, signs of infection    Will try and get records from Dr. Julien Girt, including DEXA, pap. She is due for mammogram, which she now gets from Dr. Danae Chen office.  Advised of recommendation to wait 4-6 weeks after 2nd COVID vaccine (and can give the hematoma  some time to improve).  Advised to use moist heat to hematoma.  Declines sleep eval (strongly encouraged); treating herself for OSA with her partner's machine.  Counseled extensively regarding her wine intake and potential consequences affecting her health (weight, TG, sugars).  She seems to like it and have trouble limiting it to just one glass--encouraged her to cut down on frequency--perhaps just order when in a restaurant where she and boyfriend split a bottle (limiting portions and frequency). Discussed other "routines" for relaxing in the evenings.   Cbc, c-met, lipids, D, A1c  Discussed monthly self breast exams and yearly mammograms; at least 30 minutes of aerobic activity at least 5 days/week, weight-bearing exercise at least 2x/wk; proper sunscreen use reviewed; healthy diet, including goals of calcium and vitamin D intake and alcohol recommendations (less than or equal to 1 drink/day) reviewed; regular seatbelt use; changing batteries in smoke detectors. Immunization  recommendations discussed, UTD.Shingrix recommended and risks/side effects reviewed. She will return on a Friday for NV due to SE's.  Colon cancer screening is UTD, Cologuard due again 10/2021.  FTF time with patient 60-65 minutes.  Additional time spent in chart review and documentation.  F/u 6 mos for med check; CPE 1 year NV for Shingrix on a Friday

## 2019-08-16 ENCOUNTER — Encounter: Payer: Self-pay | Admitting: Family Medicine

## 2019-08-16 ENCOUNTER — Other Ambulatory Visit: Payer: Self-pay

## 2019-08-16 ENCOUNTER — Ambulatory Visit (INDEPENDENT_AMBULATORY_CARE_PROVIDER_SITE_OTHER): Payer: 59 | Admitting: Family Medicine

## 2019-08-16 VITALS — BP 124/72 | HR 84 | Temp 97.3°F | Ht 65.0 in | Wt 179.6 lb

## 2019-08-16 DIAGNOSIS — R29818 Other symptoms and signs involving the nervous system: Secondary | ICD-10-CM

## 2019-08-16 DIAGNOSIS — I10 Essential (primary) hypertension: Secondary | ICD-10-CM | POA: Diagnosis not present

## 2019-08-16 DIAGNOSIS — S20211A Contusion of right front wall of thorax, initial encounter: Secondary | ICD-10-CM

## 2019-08-16 DIAGNOSIS — J309 Allergic rhinitis, unspecified: Secondary | ICD-10-CM

## 2019-08-16 DIAGNOSIS — Z5181 Encounter for therapeutic drug level monitoring: Secondary | ICD-10-CM

## 2019-08-16 DIAGNOSIS — R7301 Impaired fasting glucose: Secondary | ICD-10-CM | POA: Diagnosis not present

## 2019-08-16 DIAGNOSIS — Z0000 Encounter for general adult medical examination without abnormal findings: Secondary | ICD-10-CM | POA: Diagnosis not present

## 2019-08-16 DIAGNOSIS — R002 Palpitations: Secondary | ICD-10-CM

## 2019-08-16 DIAGNOSIS — E782 Mixed hyperlipidemia: Secondary | ICD-10-CM | POA: Diagnosis not present

## 2019-08-16 DIAGNOSIS — E559 Vitamin D deficiency, unspecified: Secondary | ICD-10-CM

## 2019-08-17 LAB — VITAMIN D 25 HYDROXY (VIT D DEFICIENCY, FRACTURES): Vit D, 25-Hydroxy: 45 ng/mL (ref 30.0–100.0)

## 2019-08-17 LAB — CBC WITH DIFFERENTIAL/PLATELET
Basophils Absolute: 0 10*3/uL (ref 0.0–0.2)
Basos: 1 %
EOS (ABSOLUTE): 0.3 10*3/uL (ref 0.0–0.4)
Eos: 5 %
Hematocrit: 41.1 % (ref 34.0–46.6)
Hemoglobin: 13.8 g/dL (ref 11.1–15.9)
Immature Grans (Abs): 0 10*3/uL (ref 0.0–0.1)
Immature Granulocytes: 1 %
Lymphocytes Absolute: 1.7 10*3/uL (ref 0.7–3.1)
Lymphs: 31 %
MCH: 31.6 pg (ref 26.6–33.0)
MCHC: 33.6 g/dL (ref 31.5–35.7)
MCV: 94 fL (ref 79–97)
Monocytes Absolute: 0.4 10*3/uL (ref 0.1–0.9)
Monocytes: 8 %
Neutrophils Absolute: 3 10*3/uL (ref 1.4–7.0)
Neutrophils: 54 %
Platelets: 257 10*3/uL (ref 150–450)
RBC: 4.37 x10E6/uL (ref 3.77–5.28)
RDW: 12 % (ref 11.7–15.4)
WBC: 5.5 10*3/uL (ref 3.4–10.8)

## 2019-08-17 LAB — LIPID PANEL
Chol/HDL Ratio: 2.9 ratio (ref 0.0–4.4)
Cholesterol, Total: 220 mg/dL — ABNORMAL HIGH (ref 100–199)
HDL: 77 mg/dL (ref >39)
LDL Chol Calc (NIH): 107 mg/dL — ABNORMAL HIGH (ref 0–99)
Triglycerides: 214 mg/dL — ABNORMAL HIGH (ref 0–149)
VLDL Cholesterol Cal: 36 mg/dL (ref 5–40)

## 2019-08-17 LAB — COMPREHENSIVE METABOLIC PANEL
ALT: 41 IU/L — ABNORMAL HIGH (ref 0–32)
AST: 31 IU/L (ref 0–40)
Albumin/Globulin Ratio: 2.7 — ABNORMAL HIGH (ref 1.2–2.2)
Albumin: 5.2 g/dL — ABNORMAL HIGH (ref 3.8–4.8)
Alkaline Phosphatase: 79 IU/L (ref 39–117)
BUN/Creatinine Ratio: 18 (ref 12–28)
BUN: 13 mg/dL (ref 8–27)
Bilirubin Total: 0.2 mg/dL (ref 0.0–1.2)
CO2: 21 mmol/L (ref 20–29)
Calcium: 9.9 mg/dL (ref 8.7–10.3)
Chloride: 104 mmol/L (ref 96–106)
Creatinine, Ser: 0.74 mg/dL (ref 0.57–1.00)
GFR calc Af Amer: 100 mL/min/{1.73_m2} (ref >59)
GFR calc non Af Amer: 87 mL/min/{1.73_m2} (ref >59)
Globulin, Total: 1.9 g/dL (ref 1.5–4.5)
Glucose: 113 mg/dL — ABNORMAL HIGH (ref 65–99)
Potassium: 5 mmol/L (ref 3.5–5.2)
Sodium: 142 mmol/L (ref 134–144)
Total Protein: 7.1 g/dL (ref 6.0–8.5)

## 2019-08-17 LAB — HEMOGLOBIN A1C
Est. average glucose Bld gHb Est-mCnc: 117 mg/dL
Hgb A1c MFr Bld: 5.7 % — ABNORMAL HIGH (ref 4.8–5.6)

## 2019-08-19 ENCOUNTER — Other Ambulatory Visit: Payer: Self-pay | Admitting: Family Medicine

## 2019-08-19 DIAGNOSIS — E782 Mixed hyperlipidemia: Secondary | ICD-10-CM

## 2019-08-22 ENCOUNTER — Other Ambulatory Visit: Payer: Self-pay | Admitting: Family Medicine

## 2019-08-22 DIAGNOSIS — E782 Mixed hyperlipidemia: Secondary | ICD-10-CM

## 2019-08-25 ENCOUNTER — Other Ambulatory Visit: Payer: 59

## 2019-11-14 NOTE — Progress Notes (Deleted)
Cardiology Office Note:   Date:  11/14/2019  NAME:  Laura Payne    MRN: [id number] DOB:  August 12, 1955   PCP:  Rita Ohara, MD  Cardiologist:  Evalina Field, MD  Electrophysiologist:  None   Referring MD: Rita Ohara, MD   No chief complaint on file. ***  History of Present Illness:   Laura Payne is a 64 y.o. female with a hx of HTN, HLD who presents for follow-up of palpitations. Evaluated last year for palpitations and found to have brief SVT episodes of 4-6 second duration. Symptoms improved with cutting back on caffeine and alcohol.   Past Medical History: Past Medical History:  Diagnosis Date  . Allergy   . Dyslipidemia    hypercholesterolemia  . GERD (gastroesophageal reflux disease)   . Goiter    u/s 07/2004, generalized enlargement without cysts or nodules; normal TSH  . 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 Hx gestational diabetes   . Hypertension   . Impaired fasting glucose   . Lipoma of axilla    h/o left  . Low sodium levels    history of low sodium with diuretics in the past.  . Partial hamstring tear 03/2016  . Suspected sleep apnea    never assessed/evaluated, but using SO's CPAP or BiPAP machine for years and feels better. Noted by MD only in 07/2019    Past Surgical History: Past Surgical History:  Procedure Laterality Date  . DILATION AND CURETTAGE OF UTERUS    . TONSILLECTOMY      Current Medications: No outpatient medications have been marked as taking for the 11/16/19 encounter (Appointment) with O'Neal, Cassie Freer, MD.     Allergies:    Codeine, Hctz [hydrochlorothiazide], Latex, and Penicillins   Social History: Social History   Socioeconomic History  . Marital status: Divorced    Spouse name: Not on file  . Number of children: 1  . Years of education: Not on file  . Highest education level: Not on file  Occupational History  . Occupation: Nurse, children's: [employer]  Tobacco Use  . Smoking status: Former Smoker    Packs/day: 0.10    Years: 41.00    Pack years: 4.10    Types: Cigarettes    Quit date: 10/07/2013    Years since quitting: 6.1  . Smokeless tobacco: Never Used  Vaping Use  . Vaping Use: Never used  Substance and Sexual Activity  . Alcohol use: Yes    Alcohol/week: 0.0 standard drinks    Comment: 2-3 glasses every other day  . Drug use: No  . Sexual activity: Yes    Partners: Male    Birth control/protection: Post-menopausal  Other Topics Concern  . Not on file  Social History Narrative   Lives with her son. He has a condo, but hasn't moved in yet.   Son works for her.   No pets.   Architect. 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    Monogamous, committed relationship   Social Determinants of Radio broadcast assistant Strain:   . Difficulty of Paying Living Expenses:   Food Insecurity:   . Worried About Charity fundraiser in the Last Year:   . Arboriculturist in the Last Year:   Transportation Needs:   . Film/video editor (Medical):   Marland Kitchen Lack of  Transportation (Non-Medical):   Physical Activity:   . Days of Exercise per Week:   . Minutes of Exercise per Session:   Stress:   . Feeling of Stress :   Social Connections:   . Frequency of Communication with Friends and Family:   . Frequency of Social Gatherings with Friends and Family:   . Attends Religious Services:   . Active Member of Clubs or Organizations:   . Attends Archivist Meetings:   Marland Kitchen Marital Status:      Family History: The patient's ***family history includes Arthritis in her cousin; Atrial fibrillation in her mother; Breast cancer (age of onset: 18) in her paternal grandmother; Cancer in her maternal grandfather and mother; Cancer (age of onset: 11) in her father; Cancer (age of onset: 9) in her paternal uncle; Crohn's disease in her cousin; Dementia in her mother; Diabetes in her cousin  and maternal uncle; Heart disease in her mother; Hypertension in her mother; Osteoporosis in her mother; Ovarian cancer in her mother; Psoriasis in her cousin; Transient ischemic attack in her mother.  ROS:   All other ROS reviewed and negative. Pertinent positives noted in the HPI.     EKGs/Labs/Other Studies Reviewed:   The following studies were personally reviewed by me today:  EKG:  EKG is *** ordered today.  The ekg ordered today demonstrates ***, and was personally reviewed by me.   TTE 02/22/2019 1. Left ventricular ejection fraction, by visual estimation, is 60 to  65%. The left ventricle has normal function. There is mildly increased  left ventricular hypertrophy.  2. Global right ventricle has normal systolic function.The right  ventricular size is normal. No increase in right ventricular wall  thickness.  3. Left atrial size was normal.  4. Right atrial size was normal.  5. The mitral valve is normal in structure. No evidence of mitral valve  regurgitation. No evidence of mitral stenosis.  6. The tricuspid valve is normal in structure. Tricuspid valve  regurgitation is not demonstrated.  7. The aortic valve is tricuspid. Aortic valve regurgitation is not  visualized. Mild aortic valve sclerosis without stenosis.  8. The pulmonic valve was normal in structure. Pulmonic valve  regurgitation is not visualized.  9. The inferior vena cava is normal in size with greater than 50%  respiratory variability, suggesting right atrial pressure of 3 mmHg.  10. The interatrial septum was not well visualized.   Zio 03/27/2019 1. 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  2. Rare ectopy.  3. No atrial fibrillation.    Recent Labs: 02/15/2019: TSH 1.030 08/16/2019: ALT 41; BUN 13; Creatinine, Ser 0.74; Hemoglobin 13.8; Platelets 257; Potassium 5.0; Sodium 142   Recent Lipid Panel    Component Value Date/Time   CHOL 220  (H) 08/16/2019 0959   TRIG 214 (H) 08/16/2019 0959   HDL 77 08/16/2019 0959   CHOLHDL 2.9 08/16/2019 0959   CHOLHDL 3.0 11/03/2016 1230   VLDL 35 (H) 11/03/2016 1230   LDLCALC 107 (H) 08/16/2019 9678    Physical Exam:   VS:  There were no vitals taken for this visit.   Wt Readings from Last 3 Encounters:  08/16/19 179 lb 9.6 oz (81.5 kg)  03/31/19 177 lb (80.3 kg)  02/16/19 177 lb 6.4 oz (80.5 kg)    General: Well nourished, well developed, in no acute distress Heart: Atraumatic, normal size  Eyes: PEERLA, EOMI  Neck: Supple, no JVD Endocrine: No thryomegaly  Cardiac: Normal S1, S2; RRR; no murmurs, rubs, or gallops Lungs: Clear to auscultation bilaterally, no wheezing, rhonchi or rales  Abd: Soft, nontender, no hepatomegaly  Ext: No edema, pulses 2+ Musculoskeletal: No deformities, BUE and BLE strength normal and equal Skin: Warm and dry, no rashes   Neuro: Alert and oriented to person, place, time, and situation, CNII-XII grossly intact, no focal deficits  Psych: Normal mood and affect   ASSESSMENT:   Laura Payne is a 64 y.o. female who presents for the following: No diagnosis found.  PLAN:   There are no diagnoses linked to this encounter.  Disposition: No follow-ups on file.  Medication Adjustments/Labs and Tests Ordered: Current medicines are reviewed at length with the patient today.  Concerns regarding medicines are outlined above.  No orders of the defined types were placed in this encounter.  No orders of the defined types were placed in this encounter.   There are no Patient Instructions on file for this visit.   Time Spent with Patient: I have spent a total of *** minutes with patient reviewing hospital notes, telemetry, EKGs, labs and examining the patient as well as establishing an assessment and plan that was discussed with the patient.  > 50% of time was spent in direct patient care.  Signed, Addison Naegeli. Audie Box, [place]    [address] [place], [place] [address] [phone]  11/14/2019 7:55 PM

## 2019-11-16 ENCOUNTER — Ambulatory Visit: Payer: 59 | Admitting: Cardiovascular Disease

## 2020-01-22 ENCOUNTER — Other Ambulatory Visit: Payer: Self-pay | Admitting: Obstetrics and Gynecology

## 2020-01-22 DIAGNOSIS — N644 Mastodynia: Secondary | ICD-10-CM

## 2020-02-01 ENCOUNTER — Other Ambulatory Visit: Payer: Self-pay

## 2020-02-01 ENCOUNTER — Ambulatory Visit
Admission: RE | Admit: 2020-02-01 | Discharge: 2020-02-01 | Disposition: A | Discharge status: Home / Self Care | Payer: 59 | Source: Ambulatory Visit | Attending: Obstetrics and Gynecology | Admitting: Obstetrics and Gynecology

## 2020-02-01 ENCOUNTER — Ambulatory Visit: Payer: 59

## 2020-02-01 DIAGNOSIS — N644 Mastodynia: Secondary | ICD-10-CM

## 2020-02-20 NOTE — Progress Notes (Signed)
Virtual Visit via Telephone Note   This visit type was conducted due to national recommendations for restrictions regarding the COVID-19 Pandemic (e.g. social distancing) in an effort to limit this patient's exposure and mitigate transmission in our community.  Due to her co-morbid illnesses, this patient is at least at moderate risk for complications without adequate follow up.  This format is felt to be most appropriate for this patient at this time.  The patient did not have access to video technology/had technical difficulties with video requiring transitioning to audio format only (telephone).  All issues noted in this document were discussed and addressed.  No physical exam could be performed with this format.  Please refer to the patient's chart for her  consent to telehealth for [facility].    Date:  02/21/2020   ID:  Laura Payne, DOB 1956-01-07, MRN [id number] The patient was identified using 2 identifiers.  Patient Location: Home Provider Location: Office/Clinic  PCP:  Rita Ohara, MD  Cardiologist:  Evalina Field, MD   Evaluation Performed:  Follow-Up Visit  Chief Complaint:  Palpitations   History of Present Illness:    Laura Payne is a 64 y.o. female with HTN, HLD who presents for follow-up of palpitations. Seen last year for palpitations. Symptoms associated with increased caffeine consumption. Had brief SVT on monitor that did not require treatment.  She reports she is doing well.  She is only had 1 episodes of palpitations in the last 1 year.  Had an episode a few weeks ago.  Lasted seconds.  She is worked on reducing caffeine consumption.  She still working on reducing Diet Coke.  She is also not exercising but does a lot of walking during the day.  Her blood pressures well controlled at 122/88.  Weight still puts her borderline overweight.  She requests take Metformin.  She reports that she is read several wellness books that this is a wonderful drug for her to  take.  We did discuss the side effects of possible hypoglycemia although they are minimal.  She reports she is okay to try it.  We will start her off on a low dose.  Her most recent lipid profile shows a total cholesterol of 220, HDL 77, LDL 107, triglycerides 214.  Her most recent A1c was 5.7.  We did discuss calcium scoring.  She is interested in this.  This will allow for further risk ratification.  Given her high HDL cholesterol she likely will have minimal plaque.  She was a former smoker but quit a number of years ago.  Blood pressure well controlled.  Cholesterol needs further risk ratification.  She denies any symptoms of chest pain, shortness of breath or palpitations on the phone today.  Problem List 1. HTN 2. HLD -Tchol 220, HDL 77, LDL 107, TG 214  The patient does not have symptoms concerning for COVID-19 infection (fever, chills, cough, or new shortness of breath).    Past Medical History:  Diagnosis Date  . Allergy   . Dyslipidemia    hypercholesterolemia  . GERD (gastroesophageal reflux disease)   . Goiter    u/s 07/2004, generalized enlargement without cysts or nodules; normal TSH  . 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 Hx gestational diabetes   . Hypertension   . Impaired fasting glucose   . Lipoma of axilla    h/o left  . Low sodium levels  history of low sodium with diuretics in the past.  . Partial hamstring tear 03/2016  . Suspected sleep apnea    never assessed/evaluated, but using SO's CPAP or BiPAP machine for years and feels better. Noted by MD only in 07/2019   Past Surgical History:  Procedure Laterality Date  . DILATION AND CURETTAGE OF UTERUS    . TONSILLECTOMY       Current Meds  Medication Sig  . aspirin 81 MG tablet Take 81 mg by mouth daily.   Marland Kitchen BIOTIN PO Take by mouth.  . Cholecalciferol (VITAMIN D) 2000 units CAPS Take 1 capsule by mouth.  . Coenzyme Q10 (COQ10 PO) Take 1 capsule by mouth  daily.  Marland Kitchen esomeprazole (NEXIUM) 20 MG capsule Take 20 mg by mouth daily at 12 noon.   Marland Kitchen ibuprofen (ADVIL,MOTRIN) 200 MG tablet Take 800 mg by mouth every 6 (six) hours as needed.  Marland Kitchen lisinopril (ZESTRIL) 40 MG tablet Take 1 tablet (40 mg total) by mouth daily.  . Multiple Vitamins-Minerals (WOMENS 50+ MULTI VITAMIN/MIN) TABS Take 1 tablet by mouth daily.  . rosuvastatin (CRESTOR) 10 MG tablet TAKE 1 TABLET(10 MG) BY MOUTH DAILY  . TURMERIC PO Take 2 tablets by mouth daily.     Allergies:   Codeine, Hctz [hydrochlorothiazide], Latex, and Penicillins   Social History   Tobacco Use  . Smoking status: Former Smoker    Packs/day: 0.10    Years: 41.00    Pack years: 4.10    Types: Cigarettes    Quit date: 10/07/2013    Years since quitting: 6.3  . Smokeless tobacco: Never Used  Vaping Use  . Vaping Use: Never used  Substance Use Topics  . Alcohol use: Yes    Alcohol/week: 0.0 standard drinks    Comment: 2-3 glasses every other day  . Drug use: No     Family Hx: The patient's family history includes Arthritis in her cousin; Atrial fibrillation in her mother; Breast cancer in her cousin; Breast cancer (age of onset: 77) in her paternal grandmother; Cancer in her maternal grandfather and mother; Cancer (age of onset: 45) in her father; Cancer (age of onset: 71) in her paternal uncle; Crohn's disease in her cousin; Dementia in her mother; Diabetes in her cousin and maternal uncle; Heart disease in her mother; Hypertension in her mother; Osteoporosis in her mother; Ovarian cancer in her mother; Psoriasis in her cousin; Transient ischemic attack in her mother.  ROS:   Please see the history of present illness.     All other systems reviewed and are negative.   Prior CV studies:   The following studies were reviewed today:  TTE 02/22/2019 1. Left ventricular ejection fraction, by visual estimation, is 60 to  65%. The left ventricle has normal function. There is mildly increased  left  ventricular hypertrophy.  2. Global right ventricle has normal systolic function.The right  ventricular size is normal. No increase in right ventricular wall  thickness.  3. Left atrial size was normal.  4. Right atrial size was normal.  5. The mitral valve is normal in structure. No evidence of mitral valve  regurgitation. No evidence of mitral stenosis.  6. The tricuspid valve is normal in structure. Tricuspid valve  regurgitation is not demonstrated.  7. The aortic valve is tricuspid. Aortic valve regurgitation is not  visualized. Mild aortic valve sclerosis without stenosis.  8. The pulmonic valve was normal in structure. Pulmonic valve  regurgitation is not visualized.  9. The inferior  vena cava is normal in size with greater than 50%  respiratory variability, suggesting right atrial pressure of 3 mmHg.  10. The interatrial septum was not well visualized.   Zio 03/03/2019 1. 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  2. Rare ectopy.  3. No atrial fibrillation.    Labs/Other Tests and Data Reviewed:    EKG:  No ECG reviewed.  Recent Labs: 08/16/2019: ALT 41; BUN 13; Creatinine, Ser 0.74; Hemoglobin 13.8; Platelets 257; Potassium 5.0; Sodium 142   Recent Lipid Panel Lab Results  Component Value Date/Time   CHOL 220 (H) 08/16/2019 09:59 AM   TRIG 214 (H) 08/16/2019 09:59 AM   HDL 77 08/16/2019 09:59 AM   CHOLHDL 2.9 08/16/2019 09:59 AM   CHOLHDL 3.0 11/03/2016 12:30 PM   LDLCALC 107 (H) 08/16/2019 09:59 AM    Wt Readings from Last 3 Encounters:  02/21/20 177 lb (80.3 kg)  08/16/19 179 lb 9.6 oz (81.5 kg)  03/31/19 177 lb (80.3 kg)     Risk Assessment/Calculations:       Objective:    Vital Signs:  BP 128/88   Pulse 77   Ht 5\' 5"  (1.651 m)   Wt 177 lb (80.3 kg)   BMI 29.45 kg/m    VITAL SIGNS:  reviewed  General: No acute distress Pulmonary: No shortness of breath on the phone Psych: Normal mood  and affect  ASSESSMENT & PLAN:    1. Palpitations -She had brief SVT episodes that lasted seconds last year.  She reduced her caffeine consumption's and symptoms have improved. -Echocardiogram was normal.  We will continue to monitor symptoms.  2. Essential hypertension -Blood pressure well controlled today.  No change in medications.  3. Mixed hyperlipidemia -Most recent LDL cholesterol 107.  HDL 77.  We did discuss calcium scoring for further risk ratification.  She is interested.  She does have a significant smoking history but quit a number of years ago.  She is also requested a prescription for Metformin.  She is read a lot about the benefits of Metformin.  She is not diabetic.  We will prescribe 500 mg daily.  We did discuss that she should keep an eye on blood sugars.  Hypoglycemia can be a risk factor.  This can also upset her stomach.  She will keep a close eye on this.  We will schedule her follow-up appointments based on the results of her calcium scoring.  COVID-19 Education: The signs and symptoms of COVID-19 were discussed with the patient and how to seek care for testing (follow up with PCP or arrange E-visit). The importance of social distancing was discussed today.  Time:  Today, I have spent 25 minutes with the patient with telehealth technology discussing the above problems.     Medication Adjustments/Labs and Tests Ordered: Current medicines are reviewed at length with the patient today.  Concerns regarding medicines are outlined above.   Tests Ordered: Orders Placed This Encounter  Procedures  . CT CARDIAC SCORING    Medication Changes: Meds ordered this encounter  Medications  . metFORMIN (GLUCOPHAGE) 500 MG tablet    Sig: Take 1 tablet (500 mg total) by mouth daily with breakfast.    Dispense:  30 tablet    Refill:  6    Follow Up: Follow-up will be determined after her coronary calcium score  Signed, Evalina Field, MD  02/21/2020 12:05 PM    [place]

## 2020-02-21 ENCOUNTER — Encounter: Payer: Self-pay | Admitting: Cardiovascular Disease

## 2020-02-21 ENCOUNTER — Other Ambulatory Visit: Payer: Self-pay | Admitting: Family Medicine

## 2020-02-21 ENCOUNTER — Telehealth (INDEPENDENT_AMBULATORY_CARE_PROVIDER_SITE_OTHER): Payer: 59 | Admitting: Cardiovascular Disease

## 2020-02-21 VITALS — BP 128/88 | HR 77 | Ht 65.0 in | Wt 177.0 lb

## 2020-02-21 DIAGNOSIS — E782 Mixed hyperlipidemia: Secondary | ICD-10-CM | POA: Diagnosis not present

## 2020-02-21 DIAGNOSIS — I10 Essential (primary) hypertension: Secondary | ICD-10-CM | POA: Diagnosis not present

## 2020-02-21 DIAGNOSIS — Z0000 Encounter for general adult medical examination without abnormal findings: Secondary | ICD-10-CM

## 2020-02-21 DIAGNOSIS — R002 Palpitations: Secondary | ICD-10-CM | POA: Diagnosis not present

## 2020-02-21 DIAGNOSIS — R7301 Impaired fasting glucose: Secondary | ICD-10-CM

## 2020-02-21 MED ORDER — METFORMIN HCL 500 MG PO TABS: 500.0000 mg | ORAL_TABLET | Freq: Every day | ORAL | 6 refills | Status: DC

## 2020-02-21 NOTE — Progress Notes (Signed)
Pt scheduled a CPE for may 3rd she wants to know if she can come in that Monday for fasting labs

## 2020-02-21 NOTE — Patient Instructions (Signed)
Medication Instructions:  Start Metformin 500 mg daily Continue all other medications *If you need a refill on your cardiac medications before your next appointment, please call your pharmacy*   Lab Work: None ordered   Testing/Procedures: Coronary Calcium Score    Follow-Up: At [facility], you and your health needs are our priority.  As part of our continuing mission to provide you with exceptional heart care, we have created designated Provider Care Teams.  These Care Teams include your primary Cardiologist (physician) and Advanced Practice Providers (APPs -  Physician Assistants and Nurse Practitioners) who all work together to provide you with the care you need, when you need it.  We recommend signing up for the patient portal called "MyChart".  Sign up information is provided on this After Visit Summary.  MyChart is used to connect with patients for Virtual Visits (Telemedicine).  Patients are able to view lab/test results, encounter notes, upcoming appointments, etc.  Non-urgent messages can be sent to your provider as well.   To learn more about what you can do with MyChart, go to [email or web address].    Your next appointment:  As Needed   The format for your next appointment: Office    Provider:  Dr.O'Neal

## 2020-02-22 NOTE — Progress Notes (Signed)
Called and left message with pt to get scheduled

## 2020-03-07 ENCOUNTER — Ambulatory Visit (INDEPENDENT_AMBULATORY_CARE_PROVIDER_SITE_OTHER)
Admission: RE | Admit: 2020-03-07 | Discharge: 2020-03-07 | Disposition: A | Discharge status: Home / Self Care | Payer: Self-pay | Source: Ambulatory Visit | Attending: Cardiovascular Disease | Admitting: Cardiovascular Disease

## 2020-03-07 ENCOUNTER — Other Ambulatory Visit: Payer: Self-pay

## 2020-03-07 DIAGNOSIS — E782 Mixed hyperlipidemia: Secondary | ICD-10-CM

## 2020-03-07 DIAGNOSIS — I10 Essential (primary) hypertension: Secondary | ICD-10-CM

## 2020-03-07 DIAGNOSIS — R002 Palpitations: Secondary | ICD-10-CM

## 2020-03-08 ENCOUNTER — Other Ambulatory Visit: Payer: Self-pay | Admitting: *Deleted

## 2020-03-08 MED ORDER — ROSUVASTATIN CALCIUM 20 MG PO TABS: 20.0000 mg | ORAL_TABLET | Freq: Every day | ORAL | 3 refills | Status: DC

## 2020-04-18 ENCOUNTER — Other Ambulatory Visit: Payer: Self-pay | Admitting: Cardiovascular Disease

## 2020-07-30 ENCOUNTER — Encounter: Payer: Self-pay | Admitting: Family Medicine

## 2020-07-30 ENCOUNTER — Other Ambulatory Visit: Payer: Self-pay | Admitting: Family Medicine

## 2020-07-30 MED ORDER — LISINOPRIL 40 MG PO TABS
ORAL_TABLET | ORAL | 1 refills | Status: DC
Start: 2020-07-30 — End: 2020-07-30

## 2020-07-30 NOTE — Telephone Encounter (Signed)
Pharmacy called and stated medication did not come over. Resending.

## 2020-08-21 ENCOUNTER — Encounter: Payer: 59 | Admitting: Family Medicine

## 2020-09-03 ENCOUNTER — Encounter: Payer: Self-pay | Admitting: Family Medicine

## 2020-09-04 ENCOUNTER — Other Ambulatory Visit: Payer: Self-pay | Admitting: *Deleted

## 2020-09-04 MED ORDER — ROSUVASTATIN CALCIUM 20 MG PO TABS: 20.0000 mg | ORAL_TABLET | Freq: Every day | ORAL | 0 refills | Status: DC

## 2020-09-06 ENCOUNTER — Encounter: Payer: Self-pay | Admitting: Family Medicine

## 2020-09-08 NOTE — Patient Instructions (Signed)

## 2020-09-08 NOTE — Progress Notes (Signed)
Chief Complaint  Patient presents with  . Annual Exam    Nonfasting annual exam. Sees eye doctor yearly, will give UA on way out. Sees Dr. Julien Girt for GYN yearly. Got covid booster Sat and wasn't feeling well, was supposed to come in this morning for labs, slept in-would like to come in tomorrow morning for fasting labs. Would like to schedule NV for for shingles vaccine in 2 weeks or so. She is still using [facility] Bi-pap.    Laura Payne is a 65 y.o. female who presents for a complete physical.    Hypertension:  She is compliant with lisinopril 40mg  daily, and denies side effects. She has some cough in the mornings related to allergies/PND. BP's have been running always <135/85. She denies dizziness, chest pain, shortness of breath, edema.  She has seen Dr. Audie Box (cardiology) for evaluation of palpitations in the past. She had normal echo, and heart monitor that showed sinus tachycardia (that correlated with her symptoms, only very short runs, 8 beats or less, of SVT). Symptoms improved when she decreased caffeine intake. She continues to drink just 1 cup/day. Last year she had cut back on alcohol intake to 2-3 glasses every other day. She now reports she is back to having 1/2 bottle of wine most nights.  She last saw cardiologist for a virtual visit in 02/2020.  At that time patient requested for him to put her on Metformin (after researching it herself).  She had some queasy stomach after taking it for a few days, so stopped taking it.  She is thinking of re-trying it at 1/2 tablet.     She has impaired fasting glucose and h/o GDM. Tries to limit carbs/sweets/sugar. She does, except for her wine intake.  A1c's and fasting sugars had been normal, until 02/2018, fasting sugar 117, (prior elevated sugar was 2014). Lab Results  Component Value Date   HGBA1C 5.7 (H) 08/16/2019   Coronary calcium score was 16 in 02/2020, and her crestor dose was increased to 20mg . She tolerates medication without  side effects, taking it along with coenzyme Q10.  She has had elevated TG, had been encouraged to cut back on alcohol and start omega-3 fish oil.  She is due for recheck of lipids. She is not fasting today. Lab Results  Component Value Date   CHOL 220 (H) 08/16/2019   HDL 77 08/16/2019   LDLCALC 107 (H) 08/16/2019   TRIG 214 (H) 08/16/2019   CHOLHDL 2.9 08/16/2019   On review of CT, it was noted that she had mild-mod hepatic steatosis.  She gets sinus headaches, has chronic allergies and congestion.  She is using loratidine daily, which has been helping.   Using C-pap machine, using distilled water in the machine, no longer having mouth dryness.  She never had sleep study or any assessment for sleep apnea. She said she has been using her boyfriend's extra machine for many years, started with BiPAP, but most recently has been using C-pap.   She reports that she snores when she doesn't use the machine. When using it, she feels refreshed when she wakes up . She reports she has been using it for 10 years!   She uses an ozone cleaner for the machine, every other night, and recently changed all her tubing.   Vitamin D deficiency:Last level was normal at 45 in 07/2019, when taking 2000 IU daily.  Previously had low levels, including 20 in 08/2016 and 11 in November, 2017.Shecontinues to take 2000 IU daily  at a minimum, up to 3000-4000 IU daily (throws in some extra gummies, just since COVID).  GERD: She has been takingNexium OTCwith good results,needs to take it daily. She has recurrent symptoms with missed doses (a couple of missed days). In the past she took rx. Only drinks 1 cup of coffee, no longer drinks Diet Coke, and limits gluten (only tolerates small amounts). No longer having issues with dysphagia (only with large pills).   Immunization History  Administered Date(s) Administered  . Influenza Split 05/04/2011  . Influenza,inj,Quad PF,6+ Mos 04/05/2013, 05/07/2014, 02/14/2015,  02/27/2016, 02/24/2018, 02/15/2019  . PFIZER(Purple Top)SARS-COV-2 Vaccination 06/23/2019, 07/14/2019  . Tdap 10/19/2007, 02/15/2019  Got Pfizer booster 09/07/20. Last Pap smear: with Dr. Julien Girt, in 08/2017 per pt. She reports she also had normal genetic testing.  She believes she went in October 2021 (no records received). Last mammogram: at Dr. Julien Girt' office Last colonoscopy: age 45 (11/2007, Dr. Oletta Lamas); had Cologuard 10/2018 Last DEXA: 08/2017, osteopenia (T-1.8) at GYN's office Dentist: twice yearly Ophtho: yearly Exercise:[place], there every weekend Thursday through Tuesday, walks for 30 minutes. Walks a lot of stairs in [employer]. No regular weight-bearing exercising.   PMH, PSH, SH and FH were reviewed and updated  Outpatient Encounter Medications as of 09/09/2020  Medication Sig Note  . aspirin 81 MG tablet Take 81 mg by mouth daily.    Marland Kitchen BIOTIN PO Take by mouth. 09/09/2020: 2-3 times a week  . Cholecalciferol (VITAMIN D) 2000 units CAPS Take 1 capsule by mouth. 09/09/2020: Takes 2000-4000 IU daily  . Coenzyme Q10 (COQ10 PO) Take 1 capsule by mouth daily.   Marland Kitchen esomeprazole (NEXIUM) 20 MG capsule Take 20 mg by mouth daily at 12 noon.    Marland Kitchen lisinopril (ZESTRIL) 40 MG tablet TAKE 1 TABLET(40 MG) BY MOUTH DAILY   . magnesium 30 MG tablet Take 30 mg by mouth 2 (two) times daily.   . Multiple Vitamins-Minerals (WOMENS 50+ MULTI VITAMIN/MIN) TABS Take 1 tablet by mouth daily.   . rosuvastatin (CRESTOR) 20 MG tablet Take 1 tablet (20 mg total) by mouth daily.   Marland Kitchen ibuprofen (ADVIL,MOTRIN) 200 MG tablet Take 800 mg by mouth every 6 (six) hours as needed. (Patient not taking: Reported on 09/09/2020)   . metFORMIN (GLUCOPHAGE) 500 MG tablet Take 1 tablet (500 mg total) by mouth daily with breakfast. (Patient not taking: Reported on 09/09/2020) 09/09/2020: Upset stomach-not taking  . TURMERIC PO Take 2 tablets by mouth daily. (Patient not taking: Reported on 09/09/2020) 02/15/2019:  Too big of a pill to swallow   No facility-administered encounter medications on file as of 09/09/2020.   Allergies  Allergen Reactions  . Codeine Other (See Comments)    Would make her start her period. Also just affected her body poorly so she just does not take it.  Marland Kitchen Hctz [Hydrochlorothiazide] Other (See Comments)    Hyponatremia   . Latex Rash  . Penicillins Rash    ROS: The patient denies anorexia, weight changes,vision changes, ear pain, breast concerns, chest pain, palpitations, dizziness, syncope, dyspnea on exertion, swelling, nausea, vomiting, diarrhea, constipation, abdominal pain, melena, hematochezia, hematuria, incontinence, dysuria, vaginal bleeding, discharge, odor or itch, genital lesions, weakness, tremor, suspicious skin lesions, depression, anxiety, abnormal bleeding/bruising, or enlarged lymph nodes.  +hot flashes, mild, tolerable night sweats. Unchanged. Controlled reflux, no dysphagia, see HPI.  Has seen ortho--has arthritis and pinched nerve on L, with numbness/tingling LLE; denies leg weakness.  Occasionally also has some similar issues in  her neck, intermittent. Allergie are controlled with claritin +snoring and unrefreshed sleep when not using either CPAP/BiPAP Easy bruising (unchanged, on aspirin)   PHYSICAL EXAM:  BP 140/80   Pulse 80   Ht 5\' 5"  (1.651 m)   Wt 179 lb (81.2 kg)   BMI 29.79 kg/m   138/74 on repeat by MD  Wt Readings from Last 3 Encounters:  09/09/20 179 lb (81.2 kg)  02/21/20 177 lb (80.3 kg)  08/16/19 179 lb 9.6 oz (81.5 kg)    General Appearance:   Alert, cooperative, no distress, appears stated age  Head:   Normocephalic, without obvious abnormality, atraumatic  Eyes:   PERRL, conjunctiva/corneas clear, EOM's intact, fundi benign  Ears:   Normal TM's and external ear canals  Nose:   Not examined, wearing mask due to COVID-19 pandemic  Throat:  Not examined, wearing mask due to COVID-19 pandemicl  Neck:   Supple, no lymphadenopathy; thyroid: nosignificant enlargement/tenderness/nodules; no carotidbruit or JVD  Back:  Spine nontender, no curvature, ROM normal, no CVA tenderness  Lungs:   Clear to auscultation bilaterally without wheezes, rales or ronchi; respirations unlabored  Chest Wall:  Nontender, no deformity  Heart:   Regular rate and rhythm, S1 and S2 normal, no murmur, rub or gallop  Breast Exam:   Deferred to GYN  Abdomen:   Soft, non-tender, nondistended, normoactive bowel sounds, no masses, no hepatosplenomegaly  Genitalia:   Deferred to GYN     Extremities:  No clubbing, cyanosis or edema  Pulses:  2+ and symmetric all extremities  Skin:  Skin color, texture, turgor normal. Exam limited (not undressed)  Lymph nodes:  Cervical, supraclavicular nodes normal  Neurologic:  Normal strength, sensation and gait; reflexes 2+ and symmetric.   Psych: Normal mood, affect, hygiene and grooming   ASSESSMENT/PLAN:  Annual physical exam - Plan: POCT Urinalysis DIP (Proadvantage Device)  Impaired fasting glucose - Encouraged her to cut back on wine intake, weight loss.  Would prefer this over use of metformin.  Can try 1/2 tab BID if she prefers (rx'd by cardiologist)  Essential hypertension, benign - controlled  Mixed hyperlipidemia - due for recheck. TG elevated on last check; crestor dose increased by cardiologist in 02/2020. Encouraged limiting alcohol  Allergic rhinitis, unspecified seasonality, unspecified trigger - controlled with claritin.  Discussed Flonase, and proper use, if needed  Vitamin D deficiency - continue daily supplement  Hepatic steatosis - noted on CT 02/2020.  Encouraged weight loss, limiting alcohol, lowfat diet  Suspected sleep apnea - She is on treatment, and feels good  Refuses sleep study. Reviewed potential risks; good to know she has changed out tubing and is cleaning machine  Excessive  drinking of alcohol - counseled on new recommendations (lack of CV benefit from "moderate" intake), how it is affecting her liver (fatty), TG, sugars, weight   Counseled extensively regarding her wine intake and potential consequences affecting her health (weight, TG, sugars, fatty liver).  She seems to like it and have trouble limiting it to just one glass--encouraged her to cut down on frequency--perhaps just order when in a restaurant where she and boyfriend split a bottle (limiting portions and frequency). Discussed other "routines" for relaxing in the evenings.  Discussed monthly self breast exams and yearly mammograms; at least 30 minutes of aerobic activity at least 5 days/week, weight-bearing exercise at least 2x/wk; proper sunscreen use reviewed; healthy diet, including goals of calcium and vitamin D intake and alcohol recommendations (less than or equal to 1 drink/day)  reviewed; regular seatbelt use; changing batteries in smoke detectors. Immunization recommendations discussed--yearly flu shots recommended. Shingrix recommended and risks/side effects reviewed. She will return on a Friday for NV due to SE's.  Colon cancer screening is UTD, Cologuard due again 10/2021.  To return for fasting labs tomorrow, Shingrix within the next few weeks (just got COVID booster, needs to wait 2 weeks).  F/u based on labs (6 mos vs 1 year).

## 2020-09-09 ENCOUNTER — Other Ambulatory Visit: Payer: Self-pay

## 2020-09-09 ENCOUNTER — Ambulatory Visit (INDEPENDENT_AMBULATORY_CARE_PROVIDER_SITE_OTHER): Payer: 59 | Admitting: Family Medicine

## 2020-09-09 ENCOUNTER — Encounter: Payer: Self-pay | Admitting: Family Medicine

## 2020-09-09 VITALS — BP 138/74 | HR 80 | Ht 65.0 in | Wt 179.0 lb

## 2020-09-09 DIAGNOSIS — K760 Fatty (change of) liver, not elsewhere classified: Secondary | ICD-10-CM

## 2020-09-09 DIAGNOSIS — E782 Mixed hyperlipidemia: Secondary | ICD-10-CM

## 2020-09-09 DIAGNOSIS — J309 Allergic rhinitis, unspecified: Secondary | ICD-10-CM

## 2020-09-09 DIAGNOSIS — Z0000 Encounter for general adult medical examination without abnormal findings: Secondary | ICD-10-CM | POA: Diagnosis not present

## 2020-09-09 DIAGNOSIS — R7301 Impaired fasting glucose: Secondary | ICD-10-CM | POA: Diagnosis not present

## 2020-09-09 DIAGNOSIS — E559 Vitamin D deficiency, unspecified: Secondary | ICD-10-CM

## 2020-09-09 DIAGNOSIS — I10 Essential (primary) hypertension: Secondary | ICD-10-CM

## 2020-09-09 DIAGNOSIS — R29818 Other symptoms and signs involving the nervous system: Secondary | ICD-10-CM

## 2020-09-09 DIAGNOSIS — F1010 Alcohol abuse, uncomplicated: Secondary | ICD-10-CM

## 2020-09-09 LAB — POCT URINALYSIS DIP (PROADVANTAGE DEVICE)
Blood, UA: NEGATIVE
Glucose, UA: NEGATIVE mg/dL
Leukocytes, UA: NEGATIVE
Nitrite, UA: NEGATIVE
Protein Ur, POC: NEGATIVE mg/dL
Specific Gravity, Urine: 1.03
Urobilinogen, Ur: NEGATIVE
pH, UA: 6 (ref 5.0–8.0)

## 2020-09-10 ENCOUNTER — Ambulatory Visit: Payer: 59

## 2020-09-16 ENCOUNTER — Other Ambulatory Visit: Payer: Self-pay | Admitting: Family Medicine

## 2020-09-16 NOTE — Telephone Encounter (Signed)
Only approving #30 instead of #90 because she wasn't fasting at her visit, and no-showed her lab visit.  Will change to 90d with next refill assuming she gets her labs done (last lipids 07/2019).

## 2020-09-19 ENCOUNTER — Other Ambulatory Visit: Payer: Self-pay | Admitting: Family Medicine

## 2020-09-24 ENCOUNTER — Encounter: Payer: Self-pay | Admitting: Family Medicine

## 2020-09-27 ENCOUNTER — Other Ambulatory Visit: Payer: 59

## 2020-10-10 ENCOUNTER — Encounter: Payer: Self-pay | Admitting: Family Medicine

## 2021-01-14 ENCOUNTER — Telehealth: Payer: Self-pay | Admitting: Family Medicine

## 2021-01-14 NOTE — Telephone Encounter (Signed)
Per Dr. Tomi Bamberger pt was called and a message was left for pt to schedule a medcheck appt with fasting labs prior.

## 2021-01-15 ENCOUNTER — Encounter: Payer: Self-pay | Admitting: *Deleted

## 2021-01-16 ENCOUNTER — Other Ambulatory Visit: Payer: Self-pay | Admitting: Family Medicine

## 2021-03-14 ENCOUNTER — Other Ambulatory Visit: Payer: Self-pay | Admitting: Cardiovascular Disease

## 2021-03-17 ENCOUNTER — Encounter: Payer: Self-pay | Admitting: Cardiovascular Disease

## 2021-03-17 ENCOUNTER — Encounter: Payer: Self-pay | Admitting: Family Medicine

## 2021-03-19 ENCOUNTER — Encounter: Payer: Self-pay | Admitting: Family Medicine

## 2021-03-19 ENCOUNTER — Other Ambulatory Visit: Payer: Self-pay

## 2021-03-19 ENCOUNTER — Telehealth (INDEPENDENT_AMBULATORY_CARE_PROVIDER_SITE_OTHER): Payer: 59 | Admitting: Family Medicine

## 2021-03-19 VITALS — HR 68 | Temp 98.3°F | Ht 65.0 in | Wt 177.0 lb

## 2021-03-19 DIAGNOSIS — R0989 Other specified symptoms and signs involving the circulatory and respiratory systems: Secondary | ICD-10-CM

## 2021-03-19 NOTE — Progress Notes (Signed)
Start time: 1:29; 1:38 finally called patient after not joining call for her 1:30 appt. She states she didn't see the multiple texts/invitations sent End time: 2:01  Virtual Visit via Video Note  I connected with Laura Payne on 03/19/21 by a video enabled telemedicine application and verified that I am speaking with the correct person using two identifiers.  Location: Patient: home Provider: office   I discussed the limitations of evaluation and management by telemedicine and the availability of in person appointments. The patient expressed understanding and agreed to proceed.  History of Present Illness:  Chief Complaint  Patient presents with   Cough    VIRTUAL cough, body aches that come and go. Facial pain and pressure and teeth hurt. Upset stomach, no appetite. HA and ST. Symptoms started. Took several at home covid tests-negative(last one was Sunday).    Her boyfriend got sick 11/16, had COVID exposure at work.  Friends they were out with also got sick.  He tested negative 11/18 Saturday 11/19 she started with GI symptoms, just like boyfriend--she had general malaise, slight cough, upset stomach (queasy). Also had some aches in neck, arm.  Never had vomiting or diarrhea.   Thanksgiving she felt fine, but stayed home.  Friday (11/25) she pulled out her Christmas decorations.  That afternoon she started with nasal congestion, chest congestion, body aches, hot flashes (but never had fever when checked).  Had some aches in neck and arm recur. Tired, achey.  She has had terrible headaches, mainly at night, across the whole top of her head (like a helmet crushing her skull).  Doesn't have HA currently. Cough sounds wet, hasn't been able to expectorate the phlegm. Nasal drainage is clear.  Taking ibuprofen for headaches, no other OTC meds.  She had 3 negative COVID tests, last was 11/27.   PHYSICAL EXAM:  Outpatient Encounter Medications as of 03/19/2021  Medication Sig Note    aspirin 81 MG tablet Take 81 mg by mouth daily.     BIOTIN PO Take by mouth. 09/09/2020: 2-3 times a week   Cholecalciferol (VITAMIN D) 2000 units CAPS Take 1 capsule by mouth.    Coenzyme Q10 (COQ10 PO) Take 1 capsule by mouth daily.    esomeprazole (NEXIUM) 20 MG capsule Take 20 mg by mouth daily at 12 noon.     ibuprofen (ADVIL,MOTRIN) 200 MG tablet Take 400 mg by mouth every 6 (six) hours as needed. 03/19/2021: Last dose Monday night   lisinopril (ZESTRIL) 40 MG tablet TAKE 1 TABLET(40 MG) BY MOUTH DAILY    Multiple Vitamins-Minerals (WOMENS 50+ MULTI VITAMIN/MIN) TABS Take 1 tablet by mouth daily.    rosuvastatin (CRESTOR) 20 MG tablet TAKE 1 TABLET(20 MG) BY MOUTH DAILY    [DISCONTINUED] magnesium 30 MG tablet Take 30 mg by mouth 2 (two) times daily.    metFORMIN (GLUCOPHAGE) 500 MG tablet Take 1 tablet (500 mg total) by mouth daily with breakfast. (Patient not taking: Reported on 09/09/2020) 03/19/2021: Hasn't taken in 3 months   [DISCONTINUED] TURMERIC PO Take 2 tablets by mouth daily. (Patient not taking: Reported on 09/09/2020) 02/15/2019: Too big of a pill to swallow   No facility-administered encounter medications on file as of 03/19/2021.   Allergies  Allergen Reactions   Codeine Other (See Comments)    Would make her start her period. Also just affected her body poorly so she just does not take it.   Hctz [Hydrochlorothiazide] Other (See Comments)    Hyponatremia    Latex  Rash   Penicillins Rash   ROS: no f/c. +intermittent HA, cough, body aches, queasy stomach per HPI. No chest pain, shortness of breath, rash, bleeding or other concerns.    Observations/Objective:  Pulse 68   Temp 98.3 F (36.8 C) (Temporal)   Ht 5\' 5"  (1.651 m)   Wt 177 lb (80.3 kg)   SpO2 99%   BMI 29.45 kg/m   Well-appearing female in no distress. She is alert, oriented, speaking easily. Cranial nerves are grossly intact. Rare cough during visit (sounded wet). Exam is limited due to the  virtual nature of the visit.  Assessment and Plan:  Symptoms of upper respiratory infection (URI) - ddx includes virus vs flare of allergies related to getting Christmas decorations out.  Supportive measures were reviewed. f/u if worsening  Pt with HTN, hyperlipidemia and IFG, and is past due for labs. She had CPE 08/2020, wasn't fasting, last labs 07/2019. Reminded to schedule lab visit and f/u OV. Offered virtual med check and to get vitals at lab visit. She reported being fine with coming for in-person med check after her labs. Advised her to call and schedule this for 2 weeks, to get it on the books since we have been asking for this since May.    Follow Up Instructions:    I discussed the assessment and treatment plan with the patient. The patient was provided an opportunity to ask questions and all were answered. The patient agreed with the plan and demonstrated an understanding of the instructions.   The patient was advised to call back or seek an in-person evaluation if the symptoms worsen or if the condition fails to improve as anticipated.  I spent 33 minutes dedicated to the care of this patient, including pre-visit review of records, face to face time, post-visit ordering of testing and documentation.    Vikki Ports, MD

## 2021-03-19 NOTE — Patient Instructions (Addendum)
  Stay well hydrated. Start zyrtec once daily . You may not be able to swallow the 12 hour mucinex DM that I recommended (it is large).  Those are the ingredients I want you to have--Guafenesin and Dextromethorphan. You can get a Delsym syrup that will be the dextromethorphan that lasts for 12 hours.  You'll need to see if you can find a plain guaifenesin, and take that per instructions.  Be sure not to duplicate dextromethorphan from the 2 medications that you get, if you can't find a combination.  Continue ibuprofen and/or tylenol as needed for pain, fever, aches. Continue sinus rinses.  Contact us if you start having (or have ongoing) fevers, discolored mucus, worsening sinus pain, shortness of breath, worsening cough, or other concerns.   Please call the office to schedule your fasting labs, with office visit after. Go ahead and get it scheduled for 2 weeks, when you know you'll be feeling better. It is important that you get this done--your last bloodwork was 07/2019. We need to monitor your cholesterol, kidney and liver tests, and your sugars.

## 2021-04-24 ENCOUNTER — Other Ambulatory Visit: Payer: Self-pay | Admitting: Family Medicine

## 2021-04-24 NOTE — Telephone Encounter (Signed)
Called patient and left her a message this morning asking her to please call back. Asked her to schedule fasting lab appointment and follow up. She has not yet called back. Do you want me to go ahead and fill this for #30 or deny?

## 2021-04-30 ENCOUNTER — Encounter: Payer: Self-pay | Admitting: Family Medicine

## 2021-05-27 ENCOUNTER — Encounter: Payer: Self-pay | Admitting: Family Medicine

## 2021-05-28 ENCOUNTER — Other Ambulatory Visit: Payer: 59

## 2021-05-28 ENCOUNTER — Other Ambulatory Visit: Payer: Self-pay

## 2021-05-28 DIAGNOSIS — I10 Essential (primary) hypertension: Secondary | ICD-10-CM

## 2021-05-28 DIAGNOSIS — E782 Mixed hyperlipidemia: Secondary | ICD-10-CM

## 2021-05-28 DIAGNOSIS — R7301 Impaired fasting glucose: Secondary | ICD-10-CM

## 2021-05-28 DIAGNOSIS — Z0000 Encounter for general adult medical examination without abnormal findings: Secondary | ICD-10-CM

## 2021-05-28 LAB — LIPID PANEL

## 2021-05-29 LAB — CBC WITH DIFFERENTIAL/PLATELET
Basophils Absolute: 0 10*3/uL (ref 0.0–0.2)
Basos: 1 %
EOS (ABSOLUTE): 0.2 10*3/uL (ref 0.0–0.4)
Eos: 4 %
Hematocrit: 42.3 % (ref 34.0–46.6)
Hemoglobin: 14.3 g/dL (ref 11.1–15.9)
Immature Grans (Abs): 0 10*3/uL (ref 0.0–0.1)
Immature Granulocytes: 0 %
Lymphocytes Absolute: 1.5 10*3/uL (ref 0.7–3.1)
Lymphs: 30 %
MCH: 31.4 pg (ref 26.6–33.0)
MCHC: 33.8 g/dL (ref 31.5–35.7)
MCV: 93 fL (ref 79–97)
Monocytes Absolute: 0.4 10*3/uL (ref 0.1–0.9)
Monocytes: 8 %
Neutrophils Absolute: 2.9 10*3/uL (ref 1.4–7.0)
Neutrophils: 57 %
Platelets: 254 10*3/uL (ref 150–450)
RBC: 4.55 x10E6/uL (ref 3.77–5.28)
RDW: 12.4 % (ref 11.7–15.4)
WBC: 5.1 10*3/uL (ref 3.4–10.8)

## 2021-05-29 LAB — COMPREHENSIVE METABOLIC PANEL
ALT: 38 IU/L — ABNORMAL HIGH (ref 0–32)
AST: 33 IU/L (ref 0–40)
Albumin/Globulin Ratio: 2.3 — ABNORMAL HIGH (ref 1.2–2.2)
Albumin: 4.9 g/dL — ABNORMAL HIGH (ref 3.8–4.8)
Alkaline Phosphatase: 79 IU/L (ref 44–121)
BUN/Creatinine Ratio: 15 (ref 12–28)
BUN: 11 mg/dL (ref 8–27)
Bilirubin Total: 0.5 mg/dL (ref 0.0–1.2)
CO2: 21 mmol/L (ref 20–29)
Calcium: 10 mg/dL (ref 8.7–10.3)
Chloride: 102 mmol/L (ref 96–106)
Creatinine, Ser: 0.75 mg/dL (ref 0.57–1.00)
Globulin, Total: 2.1 g/dL (ref 1.5–4.5)
Glucose: 120 mg/dL — ABNORMAL HIGH (ref 70–99)
Potassium: 4.8 mmol/L (ref 3.5–5.2)
Sodium: 142 mmol/L (ref 134–144)
Total Protein: 7 g/dL (ref 6.0–8.5)
eGFR: 89 mL/min/{1.73_m2} (ref >59)

## 2021-05-29 LAB — HEMOGLOBIN A1C
Est. average glucose Bld gHb Est-mCnc: 123 mg/dL
Hgb A1c MFr Bld: 5.9 % — ABNORMAL HIGH (ref 4.8–5.6)

## 2021-05-29 LAB — LIPID PANEL
Chol/HDL Ratio: 3.3 ratio (ref 0.0–4.4)
Cholesterol, Total: 224 mg/dL — ABNORMAL HIGH (ref 100–199)
HDL: 68 mg/dL (ref >39)
LDL Chol Calc (NIH): 116 mg/dL — ABNORMAL HIGH (ref 0–99)
Triglycerides: 230 mg/dL — ABNORMAL HIGH (ref 0–149)
VLDL Cholesterol Cal: 40 mg/dL (ref 5–40)

## 2021-05-31 NOTE — Progress Notes (Signed)
Start time: 4:14 End time: 4:52  Virtual Visit via Video Note  I connected with Laura Payne on 06/02/21 by a video enabled telemedicine application and verified that I am speaking with the correct person using two identifiers.  Location: Patient: work Provider: office   I discussed the limitations of evaluation and management by telemedicine and the availability of in person appointments. The patient expressed understanding and agreed to proceed.  History of Present Illness:  Chief Complaint  Patient presents with   Hypertension    VIRTUAL med check and lab follow up. Patient does not have any concerns. She is still taking Crestor-had extra at home from the past. Does want flu and covid booster-will go to [employer]. Scheduled patient for 3 month fasting labs in May, as well as CPE in July. Will she need labs prior to July CPE as well?   Patient presents for med check.  She finally had her fasting labs done last week (that were ordered at her physical in May 2022), see below.    Hypertension:  She is compliant with lisinopril 40mg  daily, and denies side effects. She has some cough in the mornings related to allergies/PND, unchanged.  BP's are always <135/85 (usually 130-135/80-84). She denies dizziness, chest pain, shortness of breath, edema.   Alcohol overuse:  She has had some mild elevations in her liver tests. Mild-mod hepatic steatosis was noted on CT in 02/2020.  She continues to drink 1/2 bottle of wine most nights (same as reported in 08/2020). She states she knows she needs to cut back, seeing how it affects her liver, triglycerides, sugar.  When asked the "why" of drinking she stated that her boyfriend is a wine connoisseur, gets really good wines, and "I really like it".  Some is stress, some is habit.  They do not live together, but to the the beach most weekends, Thurs through [place], with him.  She has impaired fasting glucose and h/o GDM. At one point she asked cardiologist  to start Metformin (in 02/2020), felt a little queasy and only took it for a few days. She tries to limit carbs/sweets/sugar other than her wine intake.   She just started the metformin again, after seeing her labs--taking 1/2 tablet, and plans to increase to full tablet as tolerated.  "I had a long talk with myself" after seeing her labs. Found an exercise group online, which has a support component. Just started taking the metformin regularly. Working to change her nightly routine (since seeing her labs).  Hyperlipidemia: Coronary calcium score was 16 in 02/2020, and her crestor dose was increased to 20mg . She tolerated medication without side effects, when taking it along with coenzyme Q10.  She has had elevated TG, has been encouraged to cut back on alcohol and start omega-3 fish oil.  She has a hard time with large pills, was trying to take 1 BID, but admits not taking it regularly.  She was noncompliant in returning for labs. Last lipids had been checked 07/2019 (prior to last week's check). Per computer, last RF of Crestor was in 08/2020 (?30 vs 90), when she was reminded to schedule her labs. She states she picked up a 90d supply 03/19/21 (looked at [facility] app on her phone), and has been taking it.    GERD:  She has been taking two of the Nexium OTC with good results, needs to take it daily.  She states she had EGD along with her recent colonoscopy (done at [facility], no results received  yet).  She states they saw some Barretts esophagus, and prescribed pantoprazole.  This was too expensive, so is still taking 2 of the OTC Nexium daily.  She denies any heartburn, reflux.   Only drinks 1 cup of coffee, no longer drinks Diet Coke. No longer having issues with dysphagia (only with large pills).     Observations/Objective:  BP 132/88    Pulse 66    Ht 5\' 5"  (1.651 m)    Wt 172 lb (78 kg)    BMI 28.62 kg/m   Wt Readings from Last 3 Encounters:  06/02/21 172 lb (78 kg)  03/19/21 177 lb (80.3  kg)  09/09/20 179 lb (81.2 kg)   Well-appearing pleasant female, in no distress She is alert and oriented. Cranial nerves grossly intact. Normal mood, affect, hygiene and grooming. Exam is limited due to virtual nature of the visit.  Lab Results  Component Value Date   HGBA1C 5.9 (H) 05/28/2021   Fasting glucose 120    Chemistry      Component Value Date/Time   NA 142 05/28/2021 0839   K 4.8 05/28/2021 0839   CL 102 05/28/2021 0839   CO2 21 05/28/2021 0839   BUN 11 05/28/2021 0839   CREATININE 0.75 05/28/2021 0839   CREATININE 0.89 02/27/2016 1524      Component Value Date/Time   CALCIUM 10.0 05/28/2021 0839   ALKPHOS 79 05/28/2021 0839   AST 33 05/28/2021 0839   ALT 38 (H) 05/28/2021 0839   BILITOT 0.5 05/28/2021 0839     Lab Results  Component Value Date   WBC 5.1 05/28/2021   HGB 14.3 05/28/2021   HCT 42.3 05/28/2021   MCV 93 05/28/2021   PLT 254 05/28/2021   Lab Results  Component Value Date   CHOL 224 (H) 05/28/2021   HDL 68 05/28/2021   LDLCALC 116 (H) 05/28/2021   TRIG 230 (H) 05/28/2021   CHOLHDL 3.3 05/28/2021     Assessment and Plan:  Mixed hyperlipidemia - TG remains elevated, related to diet/alcohol. Pt will work on cutting back. Cont Crestor, encouraged 3000mg  fish oil - Plan: rosuvastatin (CRESTOR) 20 MG tablet  Impaired fasting glucose - glu and A1c higher than prev.  Encouraged proper diet, limiting alcohol, daily exercise, wt loss. Take metformin 250mg  BID - Plan: metFORMIN (GLUCOPHAGE) 500 MG tablet  Essential hypertension, benign - borderline.  Encouraged daily exercise, wt loss, low Na diet. Cont current meds  Counseled re: alcohol, routines, risks, cutting out entirely vs just 1-2x/week.  Reminded to get flu shot and COVID booster--plans to get at [facility] (vs [place] discussed) Shingrix recommended--discussed checking with her insurance, get at [place] vs pharmacy depending on cost.  Discussed Medicare coverage as well--unsure if she will go  on Medicare when 66 or stay on commercial, let her know the Medicare coverage for shingrix.  She will return in 3 months for fasting labs (lipids, glu, LFT, A1c) Scheduled for CPE 10/2021.  Follow Up Instructions:    I discussed the assessment and treatment plan with the patient. The patient was provided an opportunity to ask questions and all were answered. The patient agreed with the plan and demonstrated an understanding of the instructions.   The patient was advised to call back or seek an in-person evaluation if the symptoms worsen or if the condition fails to improve as anticipated.  I spent 43 minutes dedicated to the care of this patient, including pre-visit review of records, face to face time, post-visit ordering of  testing and documentation.    Vikki Ports, MD

## 2021-06-02 ENCOUNTER — Encounter: Payer: Self-pay | Admitting: Family Medicine

## 2021-06-02 ENCOUNTER — Telehealth (INDEPENDENT_AMBULATORY_CARE_PROVIDER_SITE_OTHER): Payer: 59 | Admitting: Family Medicine

## 2021-06-02 VITALS — BP 132/88 | HR 66 | Ht 65.0 in | Wt 172.0 lb

## 2021-06-02 DIAGNOSIS — R7301 Impaired fasting glucose: Secondary | ICD-10-CM

## 2021-06-02 DIAGNOSIS — I10 Essential (primary) hypertension: Secondary | ICD-10-CM

## 2021-06-02 DIAGNOSIS — E782 Mixed hyperlipidemia: Secondary | ICD-10-CM | POA: Diagnosis not present

## 2021-06-02 DIAGNOSIS — R7989 Other specified abnormal findings of blood chemistry: Secondary | ICD-10-CM

## 2021-06-02 MED ORDER — ROSUVASTATIN CALCIUM 20 MG PO TABS: ORAL_TABLET | ORAL | 1 refills | Status: DC

## 2021-06-02 MED ORDER — METFORMIN HCL 500 MG PO TABS: 250.0000 mg | ORAL_TABLET | Freq: Two times a day (BID) | ORAL | 1 refills | Status: DC

## 2021-06-03 LAB — HM MAMMOGRAPHY

## 2021-06-03 NOTE — Patient Instructions (Signed)
Today we reviewed your labs, and reviewed how the wine intake is affecting your body--weight, triglycerides, elevated glucose, elevated liver tests, and the abnormalities seen on the liver on scans in the past.  We discussed either trial of none at all, vs cutting back to just 1-2x/week, and the need to communicate this with your partner.  It sounds as though, upon seeing your results, you have already taken some steps to a healthier lifestyle and changes in routine.  Feel free to reach out to a counselor for additional help, if needed. Try and take 3000mg  of omega-3 fish oil daily, and maybe look for smaller capsules.  We also encouraged you to get your flu shot and COVID booster, either at a nurse visit at our office, or through a pharmacy.  Shingles vaccine is also recommended. You can check with your insurance to check on cost at pharmacy vs a nurse visit at our office, vs waiting for Medicare (remember if you change to medicare, it is then covered by part D, and you need to get it from the pharmacy, NOT our office).  Please reach out if you need additional help or resources.  I'm hopeful that your lab results in May will be better!

## 2021-09-03 ENCOUNTER — Other Ambulatory Visit: Payer: 59

## 2021-10-16 ENCOUNTER — Encounter: Payer: Self-pay | Admitting: Cardiovascular Disease

## 2021-10-16 MED ORDER — LISINOPRIL 40 MG PO TABS: 40.0000 mg | ORAL_TABLET | Freq: Every day | ORAL | 0 refills | Status: DC

## 2021-10-24 ENCOUNTER — Telehealth: Payer: Self-pay | Admitting: Cardiovascular Disease

## 2021-10-24 MED ORDER — LISINOPRIL 40 MG PO TABS: 40.0000 mg | ORAL_TABLET | Freq: Every day | ORAL | 0 refills | Status: DC

## 2021-10-24 NOTE — Telephone Encounter (Signed)
*  STAT* If patient is at the pharmacy, call can be transferred to refill team.   1. Which medications need to be refilled? (please list name of each medication and dose if known) lisinopril (ZESTRIL) 40 MG tablet  2. Which pharmacy/location (including street and city if local pharmacy) is medication to be sent to? [facility] [place], [address] AT [address]  3. Do they need a 30 day or 90 day supply? [place]

## 2021-10-28 ENCOUNTER — Encounter: Payer: 59 | Admitting: Family Medicine

## 2021-10-28 NOTE — Progress Notes (Unsigned)
Cardiology Office Note:   Date:  10/30/2021  NAME:  Laura Payne    MRN: [id number] DOB:  1955-07-22   PCP:  Rita Ohara, MD  Cardiologist:  Evalina Field, MD  Electrophysiologist:  None   Referring MD: Rita Ohara, MD   Chief Complaint  Patient presents with   Follow-up         History of Present Illness:   Laura Payne is a 66 y.o. female with a hx of HTN, HLD, EAT who presents for follow-up.  She reports she is doing well.  No recurrence of arrhythmia.  She reports she may have palpitations every 6 months at last seconds.  We discussed stopping her aspirin.  Her calcium score really is not that high.  She reports cramping and muscle aches with her statin.  She would like to stop.  I gave her the option to try diet and exercise versus Zetia.  She would like to try new medication which is reasonable.  Blood pressure is elevated.  She reports she has an element of whitecoat hypertension.  She reports values are better at home.  She is exercising up to 10,000 steps per day without symptoms of angina.  No shortness of breath.  She is cut back on caffeine and alcohol.  Overall seems to be doing well.  Denies any major symptoms in office.  Problem List 1. HTN 2. HLD -Tchol 220, HDL 77, LDL 107, TG 214 3. SVT -Brief ectopic atrial tachycardia  4. Coronary calcium 16 (68th percentile)  Past Medical History: Past Medical History:  Diagnosis Date   Allergy    Dyslipidemia    hypercholesterolemia   GERD (gastroesophageal reflux disease)    Goiter    u/s 07/2004, generalized enlargement without cysts or nodules; normal TSH   History of rib fracture    chronic right posterior XII rib fx seen on CT 02/2005   Hx gestational diabetes    Hypertension    Impaired fasting glucose    Lipoma of axilla    h/o left   Low sodium levels    history of low sodium with diuretics in the past.   Partial hamstring tear 03/2016   Suspected sleep apnea    never assessed/evaluated, but using  SO's CPAP or BiPAP machine for years and feels better. Noted by MD only in 07/2019    Past Surgical History: Past Surgical History:  Procedure Laterality Date   DILATION AND CURETTAGE OF UTERUS     TONSILLECTOMY      Current Medications: Current Meds  Medication Sig   Cholecalciferol (VITAMIN D) 2000 units CAPS Take 1 capsule by mouth.   Coenzyme Q10 (COQ10 PO) Take 1 capsule by mouth daily.   esomeprazole (NEXIUM) 20 MG capsule Take 20 mg by mouth daily at 12 noon.    ibuprofen (ADVIL,MOTRIN) 200 MG tablet Take 400 mg by mouth every 6 (six) hours as needed.   lisinopril (ZESTRIL) 40 MG tablet Take 1 tablet (40 mg total) by mouth daily. SCHEDULE OFFICE VISIT FOR FUTURE REFILLS. 2ND ATTEMPT.   metFORMIN (GLUCOPHAGE) 500 MG tablet Take 0.5 tablets (250 mg total) by mouth 2 (two) times daily with a meal.   vitamin B-12 (CYANOCOBALAMIN) 1000 MCG tablet Take 1,000 mcg by mouth daily.   [DISCONTINUED] aspirin 81 MG tablet Take 81 mg by mouth daily.    [DISCONTINUED] rosuvastatin (CRESTOR) 20 MG tablet TAKE 1 TABLET(20 MG) BY MOUTH DAILY     Allergies:    Codeine,  Hctz [hydrochlorothiazide], Latex, and Penicillins   Social History: Social History   Socioeconomic History   Marital status: Divorced    Spouse name: Not on file   Number of children: 1   Years of education: Not on file   Highest education level: Not on file  Occupational History   Occupation: Nurse, children's: [employer]  Tobacco Use   Smoking status: Former    Packs/day: 0.10    Years: 41.00    Total pack years: 4.10    Types: Cigarettes    Quit date: 10/07/2013    Years since quitting: 8.0   Smokeless tobacco: Never  Vaping Use   Vaping Use: Never used  Substance and Sexual Activity   Alcohol use: Yes    Alcohol/week: 0.0 standard drinks of alcohol    Comment: 2-3 glasses daily   Drug use: No   Sexual activity: Yes    Partners: Male    Birth control/protection: Post-menopausal  Other Topics  Concern   Not on file  Social History Narrative   Has 1 son.   No pets.   Architect. 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    Monogamous, committed relationship.   [place] the beach Thursdays through Tuesdays each week   Social Determinants of Health   Financial Resource Strain: Not on file  Food Insecurity: Not on file  Transportation Needs: Not on file  Physical Activity: Not on file  Stress: Not on file  Social Connections: Not on file     Family History: The patient's family history includes Arthritis in her cousin; Atrial fibrillation in her mother; Breast cancer in her cousin; Breast cancer (age of onset: 8) in her paternal grandmother; Cancer in her maternal grandfather and mother; Cancer (age of onset: 62) in her father; Cancer (age of onset: 68) in her paternal uncle; Crohn's disease in her cousin; Dementia in her mother; Diabetes in her cousin and maternal uncle; Heart disease in her mother; Hypertension in her mother; Osteoporosis in her mother; Ovarian cancer in her mother; Psoriasis in her cousin; Transient ischemic attack in her mother.  ROS:   All other ROS reviewed and negative. Pertinent positives noted in the HPI.     EKGs/Labs/Other Studies Reviewed:   The following studies were personally reviewed by me today:  CT CAC Score 03/07/2020 Coronary calcium score of 16. This was 68th percentile for age and sex matched controls.  Zio 03/27/2019 1. 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  2. Rare ectopy.  3. No atrial fibrillation.   Recent Labs: 05/28/2021: ALT 38; BUN 11; Creatinine, Ser 0.75; Hemoglobin 14.3; Platelets 254; Potassium 4.8; Sodium 142   Recent Lipid Panel    Component Value Date/Time   CHOL 224 (H) 05/28/2021 0839   TRIG 230 (H) 05/28/2021 0839   HDL 68 05/28/2021 0839   CHOLHDL 3.3 05/28/2021 0839   CHOLHDL 3.0 11/03/2016 1230   VLDL 35 (H)  11/03/2016 1230   LDLCALC 116 (H) 05/28/2021 0839    Physical Exam:   VS:  BP (!) 152/60   Pulse 68   Ht '5\' 5"'$  (1.651 m)   Wt 178 lb (80.7 kg)   SpO2 98%   BMI 29.62 kg/m    Wt Readings from Last 3 Encounters:  10/30/21 178 lb (80.7 kg)  06/02/21 172 lb (78 kg)  03/19/21 177 lb (80.3 kg)    General:  Well nourished, well developed, in no acute distress Head: Atraumatic, normal size  Eyes: PEERLA, EOMI  Neck: Supple, no JVD Endocrine: No thryomegaly Cardiac: Normal S1, S2; RRR; no murmurs, rubs, or gallops Lungs: Clear to auscultation bilaterally, no wheezing, rhonchi or rales  Abd: Soft, nontender, no hepatomegaly  Ext: No edema, pulses 2+ Musculoskeletal: No deformities, BUE and BLE strength normal and equal Skin: Warm and dry, no rashes   Neuro: Alert and oriented to person, place, time, and situation, CNII-XII grossly intact, no focal deficits  Psych: Normal mood and affect   ASSESSMENT:   Laura Payne is a 66 y.o. female who presents for the following: 1. Palpitations   2. Essential hypertension   3. Mixed hyperlipidemia   4. Agatston coronary artery calcium score less than 100     PLAN:   1. Palpitations -Brief ectopic atrial tachycardia on monitor several years ago.  No recurrence of symptoms.  She has treated this conservatively with caffeine reduction as well as regular exercise.  Having very infrequent symptoms every 6 months.  For now we will continue to monitor this.  No need for medication.  2. Essential hypertension -Slightly elevated today.  Reports an element of whitecoat hypertension.  We will monitor this conservatively.  She will let us know if values run high at home.  3. Mixed hyperlipidemia 4. Agatston coronary artery calcium score less than 100 -Elevated calcium score.  It is in the 68th percentile.  Absolute value of 16.  We discussed that aspirin is likely not needed.  We discussed that her LDL goal should be around 100.  She is wanting to  come off Crestor due to muscle aches and cramping.  LDL goal almost there.  Would recommend to stop statin and give her a trial of diet and exercise.  We also discussed repeating her calcium score in 5 years just to make sure there is no significant change.  We discussed Zetia.  For now she would like to try diet and exercise.  I will see her back in 1 year.  She will forward me the results from her primary care physician in several months.    Disposition: Return in about 1 year (around 10/31/2022).  Medication Adjustments/Labs and Tests Ordered: Current medicines are reviewed at length with the patient today.  Concerns regarding medicines are outlined above.  No orders of the defined types were placed in this encounter.  No orders of the defined types were placed in this encounter.   Patient Instructions  Medication Instructions:  STOP Aspirin STOP Rosuvastatin   *If you need a refill on your cardiac medications before your next appointment, please call your pharmacy*   Follow-Up: At [facility], you and your health needs are our priority.  As part of our continuing mission to provide you with exceptional heart care, we have created designated Provider Care Teams.  These Care Teams include your primary Cardiologist (physician) and Advanced Practice Providers (APPs -  Physician Assistants and Nurse Practitioners) who all work together to provide you with the care you need, when you need it.  We recommend signing up for the patient portal called "MyChart".  Sign up information is provided on this After Visit Summary.  MyChart is used to connect with patients for Virtual Visits (Telemedicine).  Patients are able to view lab/test results, encounter notes, upcoming appointments, etc.  Non-urgent messages can be sent to your provider as well.   To learn more about what you can do with  MyChart, go to [email or web address].    Your next appointment:   12 month(s)  The format for your next  appointment:   In Person  Provider:   Evalina Field, MD             Time Spent with Patient: I have spent a total of 25 minutes with patient reviewing hospital notes, telemetry, EKGs, labs and examining the patient as well as establishing an assessment and plan that was discussed with the patient.  > 50% of time was spent in direct patient care.  Signed, Addison Naegeli. Audie Box, MD, [place]  [address], [place],  [address] [phone]  10/30/2021 11:37 AM

## 2021-10-29 ENCOUNTER — Encounter: Payer: 59 | Admitting: Family Medicine

## 2021-10-30 ENCOUNTER — Encounter: Payer: Self-pay | Admitting: Cardiovascular Disease

## 2021-10-30 ENCOUNTER — Ambulatory Visit (INDEPENDENT_AMBULATORY_CARE_PROVIDER_SITE_OTHER): Payer: 59 | Admitting: Cardiovascular Disease

## 2021-10-30 VITALS — BP 152/60 | HR 68 | Ht 65.0 in | Wt 178.0 lb

## 2021-10-30 DIAGNOSIS — R002 Palpitations: Secondary | ICD-10-CM | POA: Diagnosis not present

## 2021-10-30 DIAGNOSIS — R931 Abnormal findings on diagnostic imaging of heart and coronary circulation: Secondary | ICD-10-CM

## 2021-10-30 DIAGNOSIS — I10 Essential (primary) hypertension: Secondary | ICD-10-CM | POA: Diagnosis not present

## 2021-10-30 DIAGNOSIS — E782 Mixed hyperlipidemia: Secondary | ICD-10-CM | POA: Diagnosis not present

## 2021-10-30 NOTE — Patient Instructions (Signed)
Medication Instructions:  STOP Aspirin STOP Rosuvastatin   *If you need a refill on your cardiac medications before your next appointment, please call your pharmacy*   Follow-Up: At [facility], you and your health needs are our priority.  As part of our continuing mission to provide you with exceptional heart care, we have created designated Provider Care Teams.  These Care Teams include your primary Cardiologist (physician) and Advanced Practice Providers (APPs -  Physician Assistants and Nurse Practitioners) who all work together to provide you with the care you need, when you need it.  We recommend signing up for the patient portal called "MyChart".  Sign up information is provided on this After Visit Summary.  MyChart is used to connect with patients for Virtual Visits (Telemedicine).  Patients are able to view lab/test results, encounter notes, upcoming appointments, etc.  Non-urgent messages can be sent to your provider as well.   To learn more about what you can do with MyChart, go to [email or web address].    Your next appointment:   12 month(s)  The format for your next appointment:   In Person  Provider:   Evalina Field, MD

## 2021-11-24 ENCOUNTER — Other Ambulatory Visit: Payer: Self-pay | Admitting: Family Medicine

## 2021-11-24 DIAGNOSIS — R7301 Impaired fasting glucose: Secondary | ICD-10-CM

## 2021-11-24 DIAGNOSIS — E782 Mixed hyperlipidemia: Secondary | ICD-10-CM

## 2021-11-27 ENCOUNTER — Encounter: Payer: Self-pay | Admitting: Cardiovascular Disease

## 2021-11-27 MED ORDER — LISINOPRIL 40 MG PO TABS: 40.0000 mg | ORAL_TABLET | Freq: Every day | ORAL | 3 refills | Status: DC

## 2021-12-24 ENCOUNTER — Encounter: Payer: Self-pay | Admitting: Internal Medicine

## 2022-01-27 ENCOUNTER — Encounter: Payer: Self-pay | Admitting: Internal Medicine

## 2022-05-05 ENCOUNTER — Encounter: Payer: Self-pay | Admitting: *Deleted

## 2022-05-07 LAB — TSH: TSH: 0.38 — AB (ref 0.41–5.90)

## 2022-05-10 ENCOUNTER — Other Ambulatory Visit: Payer: Self-pay | Admitting: Family Medicine

## 2022-05-10 DIAGNOSIS — R7301 Impaired fasting glucose: Secondary | ICD-10-CM

## 2022-05-11 ENCOUNTER — Encounter: Payer: Self-pay | Admitting: *Deleted

## 2022-05-11 NOTE — Telephone Encounter (Signed)
Left message for patient to call back-she needs fasting labs followed by med check with Dr. Tomi Bamberger.

## 2022-10-06 NOTE — Progress Notes (Unsigned)
HPI: [name] is a 67 y.o. female, who is here today to establish care.  Former PCP: Dr. Lynelle Doctor Last preventive routine visit: 09/09/20.  She reports a medical history of HLD, hypertension managed with Lisinopril 40 mg daily, and IFG managed with Metformin, half tablet twice daily. Additionally, she takes Nexium for acid reflux for GERD and is on B12 supplements.  She mentions that she discontinued Atorvastatin due to joint pain/myalgias and has not tried any other statins. She denies any history of CVA or MI.  She has experienced heart palpitations in the past, which were determined to be likely caused by caffeine after wearing a heart monitor for a week. She has been under the care of a cardiologist, Dr. Bufford Buttner, since 2021. Her mother had a history of atrial fibrillation. Coronary calcium score was 16 in November 2021.  Regarding her preventive care, her last pap smear was in 2020.  She also reports having a mammogram last year at her gynecologist's office. She reports a history of dense breasts. In 2019, she underwent a bone density test ordered by her gynecologist, her mother has history of osteoporosis.  Currently, she reports sensitivity and pain right above epigastrium, which she has noticed in the last couple of years. She has not noted skin changes or masses. Negative for cough, wheezing, or dyspnea.  She also mentions experiencing hot flashes for 20 years.  She has a history of smoking but quit in 2015. However, she admits to consuming two glasses of alcohol daily.  Lab Results  Component Value Date   CREATININE 0.75 05/28/2021   BUN 11 05/28/2021   NA 142 05/28/2021   K 4.8 05/28/2021   CL 102 05/28/2021   CO2 21 05/28/2021   Lab Results  Component Value Date   CHOL 224 (H) 05/28/2021   HDL 68 05/28/2021   LDLCALC 116 (H) 05/28/2021   TRIG 230 (H) 05/28/2021   CHOLHDL 3.3 05/28/2021   Lab Results  Component Value Date   ALT 38 (H) 05/28/2021   AST 33  05/28/2021   ALKPHOS 79 05/28/2021   BILITOT 0.5 05/28/2021   Lab Results  Component Value Date   HGBA1C 5.9 (H) 05/28/2021   She also reports hx of enlarged thyroid gland. She had a thyroid US many years ago. Last TSH 0.38 in 04/2022.  Vit D deficiency: She is on Vit D supplementation, 2000 U daily.  Review of Systems  Constitutional:  Negative for activity change, appetite change and fever.  HENT:  Negative for mouth sores, nosebleeds and trouble swallowing.   Eyes:  Negative for redness and visual disturbance.  Cardiovascular:  Negative for chest pain, palpitations and leg swelling.  Gastrointestinal:  Negative for abdominal pain, nausea and vomiting.       Negative for changes in bowel habits.  Endocrine: Negative for cold intolerance and heat intolerance.  Genitourinary:  Negative for decreased urine volume, dysuria and hematuria.  Allergic/Immunologic: Positive for environmental allergies.  Neurological:  Negative for syncope, weakness and headaches.  See other pertinent positives and negatives in HPI.  Current Outpatient Medications on File Prior to Visit  Medication Sig Dispense Refill   Cholecalciferol (VITAMIN D) 2000 units CAPS Take 1 capsule by mouth.     Coenzyme Q10 (COQ10 PO) Take 1 capsule by mouth daily.     esomeprazole (NEXIUM) 20 MG capsule Take 20 mg by mouth daily at 12 noon.      ibuprofen (ADVIL,MOTRIN) 200 MG tablet Take 400 mg by mouth  every 6 (six) hours as needed.     lisinopril (ZESTRIL) 40 MG tablet Take 1 tablet (40 mg total) by mouth daily. 90 tablet 3   metFORMIN (GLUCOPHAGE) 500 MG tablet TAKE 1/2 TABLET(250 MG) BY MOUTH TWICE DAILY WITH A MEAL 90 tablet 1   vitamin B-12 (CYANOCOBALAMIN) 1000 MCG tablet Take 1,000 mcg by mouth daily.     No current facility-administered medications on file prior to visit.   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   Partial hamstring tear 03/2016   Sleep apnea    Suspected sleep apnea    never assessed/evaluated, but using SO's CPAP or BiPAP machine for years and feels better. Noted by MD only in 07/2019   Allergies  Allergen Reactions   Codeine Other (See Comments)    Would make her start her period. Also just affected her body poorly so she just does not take it.   Hctz [Hydrochlorothiazide] Other (See Comments)    Hyponatremia    Latex Rash   Penicillins Rash    Family History  Problem Relation Age of Onset   Osteoporosis Mother    Dementia Mother    Transient ischemic attack Mother    Hypertension Mother    Heart disease Mother        atrial fibrillation   Atrial fibrillation Mother    Ovarian cancer Mother    Cancer Mother        ovarian   Cancer Father 11       osteosarcoma, metastasized to lung   Early death Father    Crohn's disease Cousin    Psoriasis Cousin    Diabetes Maternal Uncle    Cancer Paternal Uncle 2       colon   Cancer Maternal Grandfather        throat   Breast cancer Paternal Grandmother 13   Arthritis Cousin        rheumatoid   Diabetes Cousin        type 1   Breast cancer Cousin        and uterine cancer--Lynch syndrome    Social History   Socioeconomic History   Marital status: Divorced    Spouse name: Not on file   Number of children: 1   Years of education: Not on file   Highest education level: Not on file  Occupational History   Occupation: Scientist, research (life sciences): [employer]  Tobacco Use   Smoking status: Former    Packs/day: 0.10    Years: 41.00    Additional pack years: 0.00     Total pack years: 4.10    Types: Cigarettes    Quit date: 10/07/2013    Years since quitting: 9.0   Smokeless tobacco: Never  Vaping Use   Vaping Use: Never used  Substance and Sexual Activity   Alcohol use: Yes    Alcohol/week: 14.0 standard drinks of alcohol  Types: 14 Glasses of wine per week    Comment: 2-3 glasses daily   Drug use: No   Sexual activity: Yes    Partners: Male    Birth control/protection: Post-menopausal  Other Topics Concern   Not on file  Social History Narrative   Has 1 son.   No pets.   Architect. 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    Monogamous, committed relationship.   St[place] at the beach Thursdays through Tuesdays each week   Social Determinants of Health   Financial Resource Strain: Not on file  Food Insecurity: Not on file  Transportation Needs: Not on file  Physical Activity: Not on file  Stress: Not on file  Social Connections: Not on file    Vitals:   10/07/22 1020  BP: 128/80  Pulse: 85  Resp: 16  Temp: 98.4 F (36.9 C)  SpO2: 97%   Body mass index is 29.64 kg/m.  Physical Exam Vitals and nursing note reviewed.  Constitutional:      General: She is not in acute distress.    Appearance: She is well-developed.  HENT:     Head: Normocephalic and atraumatic.     Mouth/Throat:     Mouth: Mucous membranes are moist.     Pharynx: Oropharynx is clear.  Eyes:     Conjunctiva/sclera: Conjunctivae normal.  Neck:     Thyroid: Thyromegaly present. No thyroid mass.  Cardiovascular:     Rate and Rhythm: Normal rate and regular rhythm.     Pulses:          Dorsalis pedis pulses are 2+ on the right side and 2+ on the left side.     Heart sounds: No murmur heard. Pulmonary:     Effort: Pulmonary effort is normal. No respiratory distress.     Breath sounds: Normal breath sounds.  Abdominal:     Palpations: Abdomen is soft. There is no hepatomegaly or mass.     Tenderness: There is no  abdominal tenderness.    Lymphadenopathy:     Cervical: No cervical adenopathy.  Skin:    General: Skin is warm.     Findings: No erythema or rash.  Neurological:     General: No focal deficit present.     Mental Status: She is alert and oriented to person, place, and time.     Cranial Nerves: No cranial nerve deficit.     Gait: Gait normal.  Psychiatric:        Mood and Affect: Mood and affect normal.    ASSESSMENT AND PLAN:  Ms. Malyia Burker was seen today for establish care.  Diagnoses and all orders for this visit: Lab Results  Component Value Date   CHOL 329 (H) 10/07/2022   HDL 76.80 10/07/2022   LDLCALC 116 (H) 05/28/2021   LDLDIRECT 222.0 10/07/2022   TRIG 205.0 (H) 10/07/2022   CHOLHDL 4 10/07/2022   Lab Results  Component Value Date   ALT 33 10/07/2022   AST 26 10/07/2022   ALKPHOS 59 10/07/2022   BILITOT 0.5 10/07/2022   Lab Results  Component Value Date   TSH 1.03 10/07/2022   Lab Results  Component Value Date   HGBA1C 5.6 10/07/2022   Essential hypertension, benign Assessment & Plan: BP adequately controlled. Continue Lisinopril 40 mg daily and low salt diet. Eye exam q 1-2 years. She sees cardiologist annually, so I can see her annually. Recommend monitoring BP 1-2 times per month.  Orders: -     Basic metabolic panel; Future  Impaired fasting glucose Assessment & Plan: Last HgA1C 5.9 in 05/2021. Continue Metformin 500 mg 1/2 tab bid with meals. Consistency with a healthy life style encouraged for diabetes prevention.  Orders: -     Hemoglobin A1c; Future  Vitamin D deficiency Assessment & Plan: Continue Vit D 2000 U daily. Further recommendations according to 25 OH vit D result.  Orders: -     VITAMIN D 25 Hydroxy (Vit-D Deficiency, Fractures); Future  Abnormal LFTs Assessment & Plan: Mild. 02/2020 chest CT showed mild to moderate hepatic steatosis. Caution with alcohol intake advised.  Will continue monitoring  regularly.  Orders: -     Hepatic function panel; Future  Mixed hyperlipidemia Assessment & Plan: Statin caused arthralgias and myalgias. LDL 116 in 05/2021. Continue non pharmacologic treatment.  Orders: -     Lipid panel; Future -     LDL cholesterol, direct  Gastroesophageal reflux disease, unspecified whether esophagitis present Assessment & Plan: Problem is adequately controlled. She is on Nexium 20 mg daily. Continue GERD precautions.   Goiter Assessment & Plan: I could not find thyroid US, done many years ago. Thyroid US order placed. Will continue monitoring TSH annually.  Orders: -     TSH; Future -     US THYROID; Future  Return in about 1 year (around 10/07/2023) for chronic problems, CPE.  Michaila Kenney G. [place], MD  [facility]. [place] office.

## 2022-10-07 ENCOUNTER — Encounter: Payer: Self-pay | Admitting: Family Medicine

## 2022-10-07 ENCOUNTER — Ambulatory Visit: Payer: Medicare HMO | Admitting: Family Medicine

## 2022-10-07 VITALS — BP 128/80 | HR 85 | Temp 98.4°F | Resp 16 | Ht 65.0 in | Wt 178.1 lb

## 2022-10-07 DIAGNOSIS — E049 Nontoxic goiter, unspecified: Secondary | ICD-10-CM

## 2022-10-07 DIAGNOSIS — K219 Gastro-esophageal reflux disease without esophagitis: Secondary | ICD-10-CM | POA: Diagnosis not present

## 2022-10-07 DIAGNOSIS — R7301 Impaired fasting glucose: Secondary | ICD-10-CM

## 2022-10-07 DIAGNOSIS — R7989 Other specified abnormal findings of blood chemistry: Secondary | ICD-10-CM

## 2022-10-07 DIAGNOSIS — E559 Vitamin D deficiency, unspecified: Secondary | ICD-10-CM

## 2022-10-07 DIAGNOSIS — I10 Essential (primary) hypertension: Secondary | ICD-10-CM

## 2022-10-07 DIAGNOSIS — E782 Mixed hyperlipidemia: Secondary | ICD-10-CM | POA: Diagnosis not present

## 2022-10-07 LAB — VITAMIN D 25 HYDROXY (VIT D DEFICIENCY, FRACTURES): VITD: 61.9 ng/mL (ref 30.00–100.00)

## 2022-10-07 LAB — LIPID PANEL
Cholesterol: 329 mg/dL — ABNORMAL HIGH (ref 0–200)
HDL: 76.8 mg/dL (ref >39.00)
NonHDL: 252.36
Total CHOL/HDL Ratio: 4
Triglycerides: 205 mg/dL — ABNORMAL HIGH (ref 0.0–149.0)
VLDL: 41 mg/dL — ABNORMAL HIGH (ref 0.0–40.0)

## 2022-10-07 LAB — HEPATIC FUNCTION PANEL
ALT: 33 U/L (ref 0–35)
AST: 26 U/L (ref 0–37)
Albumin: 4.9 g/dL (ref 3.5–5.2)
Alkaline Phosphatase: 59 U/L (ref 39–117)
Bilirubin, Direct: 0.1 mg/dL (ref 0.0–0.3)
Total Bilirubin: 0.5 mg/dL (ref 0.2–1.2)
Total Protein: 7.7 g/dL (ref 6.0–8.3)

## 2022-10-07 LAB — HEMOGLOBIN A1C: Hgb A1c MFr Bld: 5.6 % (ref 4.6–6.5)

## 2022-10-07 LAB — BASIC METABOLIC PANEL
BUN: 17 mg/dL (ref 6–23)
CO2: 22 mEq/L (ref 19–32)
Calcium: 10.2 mg/dL (ref 8.4–10.5)
Chloride: 104 mEq/L (ref 96–112)
Creatinine, Ser: 0.76 mg/dL (ref 0.40–1.20)
GFR: 82.13 mL/min (ref >60.00)
Glucose, Bld: 108 mg/dL — ABNORMAL HIGH (ref 70–99)
Potassium: 5 mEq/L (ref 3.5–5.1)
Sodium: 140 mEq/L (ref 135–145)

## 2022-10-07 LAB — LDL CHOLESTEROL, DIRECT: Direct LDL: 222 mg/dL

## 2022-10-07 LAB — TSH: TSH: 1.03 u[IU]/mL (ref 0.35–5.50)

## 2022-10-07 NOTE — Patient Instructions (Addendum)
A few things to remember from today's visit:  Essential hypertension, benign - Plan: Basic metabolic panel  Impaired fasting glucose - Plan: Hemoglobin A1c  Vitamin D deficiency  Abnormal LFTs - Plan: Hepatic function panel  Enlarged thyroid gland - Plan: TSH, US THYROID  Mixed hyperlipidemia - Plan: Lipid panel  As far as labs are stable and blood pressure if well controlled at home, I can see you annually as fat as you continue following with cardiologist.  If you need refills for medications you take chronically, please call your pharmacy. Do not use My Chart to request refills or for acute issues that need immediate attention. If you send a my chart message, it may take a few days to be addressed, specially if I am not in the office.  Please be sure medication list is accurate. If a new problem present, please set up appointment sooner than planned today.

## 2022-10-08 ENCOUNTER — Encounter: Payer: Self-pay | Admitting: Family Medicine

## 2022-10-08 DIAGNOSIS — R7989 Other specified abnormal findings of blood chemistry: Secondary | ICD-10-CM | POA: Insufficient documentation

## 2022-10-08 DIAGNOSIS — R7301 Impaired fasting glucose: Secondary | ICD-10-CM

## 2022-10-08 NOTE — Assessment & Plan Note (Signed)
Problem is adequately controlled. She is on Nexium 20 mg daily. Continue GERD precautions.

## 2022-10-08 NOTE — Assessment & Plan Note (Signed)
Statin caused arthralgias and myalgias. LDL 116 in 05/2021. Continue non pharmacologic treatment.

## 2022-10-08 NOTE — Assessment & Plan Note (Signed)
BP adequately controlled. Continue Lisinopril 40 mg daily and low salt diet. Eye exam q 1-2 years. She sees cardiologist annually, so I can see her annually. Recommend monitoring BP 1-2 times per month.

## 2022-10-08 NOTE — Assessment & Plan Note (Signed)
Last HgA1C 5.9 in 05/2021. Continue Metformin 500 mg 1/2 tab bid with meals. Consistency with a healthy life style encouraged for diabetes prevention.

## 2022-10-08 NOTE — Assessment & Plan Note (Signed)
I could not find thyroid US, done many years ago. Thyroid US order placed. Will continue monitoring TSH annually.

## 2022-10-08 NOTE — Assessment & Plan Note (Signed)
Mild. 02/2020 chest CT showed mild to moderate hepatic steatosis. Caution with alcohol intake advised.  Will continue monitoring regularly.

## 2022-10-08 NOTE — Assessment & Plan Note (Signed)
Continue Vit D 2000 U daily. Further recommendations according to 25 OH vit D result. 

## 2022-10-09 NOTE — Telephone Encounter (Signed)
See other mychart message.

## 2022-10-26 ENCOUNTER — Encounter: Payer: Self-pay | Admitting: Cardiovascular Disease

## 2022-10-26 MED ORDER — ROSUVASTATIN CALCIUM 10 MG PO TABS: 10.0000 mg | ORAL_TABLET | Freq: Every day | ORAL | 3 refills | Status: DC

## 2022-10-26 MED ORDER — METFORMIN HCL 500 MG PO TABS
ORAL_TABLET | ORAL | 2 refills | Status: DC
Start: 2022-10-26 — End: 2023-07-26

## 2022-10-26 NOTE — Addendum Note (Signed)
Addended by: Kathreen Devoid on: 10/26/2022 03:26 PM   Modules accepted: Orders

## 2022-12-07 ENCOUNTER — Other Ambulatory Visit: Payer: Self-pay | Admitting: Cardiovascular Disease

## 2022-12-07 ENCOUNTER — Other Ambulatory Visit: Payer: Self-pay

## 2022-12-07 MED ORDER — LISINOPRIL 40 MG PO TABS: 40.0000 mg | ORAL_TABLET | Freq: Every day | ORAL | 0 refills | Status: DC

## 2022-12-07 NOTE — Telephone Encounter (Signed)
Called Pharmacy to confirm if medication was sent through, Pharmacist confirmed that script was received on there end.

## 2023-01-22 ENCOUNTER — Other Ambulatory Visit: Payer: Self-pay | Admitting: Cardiovascular Disease

## 2023-01-27 ENCOUNTER — Telehealth: Payer: Self-pay | Admitting: Family Medicine

## 2023-01-27 NOTE — Telephone Encounter (Signed)
Noted  

## 2023-01-27 NOTE — Telephone Encounter (Signed)
FYI Jennnifer with [facility] is calling to let md know the patient can now get 100 days supply of each medication metFORMIN (GLUCOPHAGE) 500 MG tablet  and rosuvastatin (CRESTOR) 10 MG tablet . Pt does not need refill yet

## 2023-02-15 ENCOUNTER — Encounter: Payer: Self-pay | Admitting: Cardiovascular Disease

## 2023-02-17 ENCOUNTER — Other Ambulatory Visit: Payer: Self-pay

## 2023-02-17 DIAGNOSIS — R931 Abnormal findings on diagnostic imaging of heart and coronary circulation: Secondary | ICD-10-CM

## 2023-02-17 MED ORDER — LISINOPRIL 40 MG PO TABS
40.0000 mg | ORAL_TABLET | Freq: Every day | ORAL | 0 refills | Status: DC
Start: 2023-02-17 — End: 2023-06-14

## 2023-05-16 NOTE — Progress Notes (Unsigned)
Cardiology Office Note:  .   Date:  05/18/2023  ID:  Laura Payne, DOB [birth year], MRN [id number] PCP: [place], Laura G, MD  [facility] Providers Cardiologist:  Reatha Harps, MD { History of Present Illness: .   Laura Payne is a 68 y.o. female with history of coronary calcium, HLD, SVT who presents for follow-up.   Discussed the use of AI scribe software for clinical note transcription with the patient, who gave verbal consent to proceed.  History of Present Illness   The patient is a 68 year old female with supraventricular tachycardia who presents for follow-up.  She has a history of supraventricular tachycardia (SVT) and has experienced brief extra heartbeats in the past, which have calmed down after reducing alcohol and caffeine intake. No current palpitations, chest pain, or trouble breathing. Recently, she experienced a single episode of waking up with a hard-beating heart, but her monitoring device did not indicate any significant event. This was a one-time occurrence.  Regarding her hyperlipidemia, she had previously decided to manage her cholesterol naturally but has since resumed taking Crestor (rosuvastatin) 10 mg daily after her cholesterol levels increased. She also takes CoQ10 and red yeast rice, which she feels helps. Her last cholesterol check showed an elevated LDL of 222 mg/dL, and she plans to have fasting labs done soon to reassess her lipid levels.  She has a history of hypertension, which is well-controlled with lisinopril 40 mg daily. Her blood pressure readings at home are similar to today's reading of 130/72 mmHg. She also mentions a past elevated coronary calcium score, with an absolute value of 16 in the 68th percentile.  In terms of lifestyle, she describes herself as having an active lifestyle, although she does not exercise on a treadmill daily. She engages in various activities that keep her moving throughout the day. She has a Land and  frequently moves around, which she believes contributes to her activity level. No chest pain, trouble breathing, or frequent episodes of rapid heartbeat. She reports a single recent episode of waking up with a hard-beating heart and a transient visual disturbance with 'silver squiggly lines' that resolved with hydration.          Problem List 1. HTN 2. HLD -Tchol 329, LDL 222, HDL 76, TG 205 3. SVT -Brief ectopic atrial tachycardia  4. Coronary calcium 16 (68th percentile)    ROS: All other ROS reviewed and negative. Pertinent positives noted in the HPI.     Studies Reviewed: Marland Kitchen   EKG Interpretation Date/Time:  Tuesday May 18 2023 13:31:26 EST Ventricular Rate:  75 PR Interval:  130 QRS Duration:  58 QT Interval:  388 QTC Calculation: 433 R Axis:   -14  Text Interpretation: Normal sinus rhythm Inferior infarct , age undetermined Confirmed by Lennie Odor [id number]) on 05/18/2023 1:37:21 PM   Physical Exam:   VS:  BP 138/72 (BP Location: Left Arm, Patient Position: Sitting, Cuff Size: Normal)   Pulse 75   Ht 5\' 5"  (1.651 m)   Wt 177 lb (80.3 kg)   BMI 29.45 kg/m    Wt Readings from Last 3 Encounters:  05/18/23 177 lb (80.3 kg)  10/07/22 178 lb 2 oz (80.8 kg)  10/30/21 178 lb (80.7 kg)    GEN: Well nourished, well developed in no acute distress NECK: No JVD; No carotid bruits CARDIAC: RRR, no murmurs, rubs, gallops RESPIRATORY:  Clear to auscultation without rales, wheezing or rhonchi  ABDOMEN: Soft, non-tender, non-distended EXTREMITIES:  No edema; No deformity  ASSESSMENT AND PLAN: .   Assessment and Plan    Supraventricular Tachycardia (SVT) No recent palpitations. Symptoms previously associated with excess caffeine and alcohol consumption, which have been reduced. -Continue conservative management.  Hyperlipidemia Recent LDL was high (222). Patient has restarted Crestor 10mg  daily and is taking CoQ10 and red yeast rice. -Perform fasting lipids in the next few  weeks to assess current cholesterol levels.  Hypertension Well controlled on Lisinopril 40mg  daily. Blood pressure today was 138/72. -Continue Lisinopril 40mg  daily.  General Health Maintenance Patient expressed interest in Ozempic for weight loss. -Advise patient to discuss Ozempic with primary care physician, Dr. Betty [place].  Follow-up Annual visit or sooner if cholesterol levels require medication adjustment.              Follow-up: Return in about 1 year (around 05/17/2024).  Signed, Laura Gilford. Flora Lipps, MD, [facility]  [facility]  [address] [place], [place] [address] [phone]  2:02 PM

## 2023-05-18 ENCOUNTER — Encounter: Payer: Self-pay | Admitting: Cardiovascular Disease

## 2023-05-18 ENCOUNTER — Ambulatory Visit: Payer: Medicare HMO | Attending: Cardiovascular Disease | Admitting: Cardiovascular Disease

## 2023-05-18 VITALS — BP 138/72 | HR 75 | Ht 65.0 in | Wt 177.0 lb

## 2023-05-18 DIAGNOSIS — I10 Essential (primary) hypertension: Secondary | ICD-10-CM

## 2023-05-18 DIAGNOSIS — E782 Mixed hyperlipidemia: Secondary | ICD-10-CM | POA: Diagnosis not present

## 2023-05-18 DIAGNOSIS — R931 Abnormal findings on diagnostic imaging of heart and coronary circulation: Secondary | ICD-10-CM

## 2023-05-18 DIAGNOSIS — I471 Supraventricular tachycardia, unspecified: Secondary | ICD-10-CM

## 2023-05-18 DIAGNOSIS — R002 Palpitations: Secondary | ICD-10-CM | POA: Diagnosis not present

## 2023-05-18 NOTE — Patient Instructions (Signed)
Medication Instructions:  Your physician recommends that you continue on your current medications as directed. Please refer to the Current Medication list given to you today.    *If you need a refill on your cardiac medications before your next appointment, please call your pharmacy*   Lab Work:  Your physician recommends that you return for lab work in: RETURN WITHIN 1 WEEK  - FASTING LIPID PANEL     If you have labs (blood work) drawn today and your tests are completely normal, you will receive your results only by: MyChart Message (if you have MyChart) OR A paper copy in the mail If you have any lab test that is abnormal or we need to change your treatment, we will call you to review the results.   Testing/Procedures: NONE    Follow-Up: At [facility] ([place]), you and your health needs are our priority.  As part of our continuing mission to provide you with exceptional heart care, we have created designated Provider Care Teams.  These Care Teams include your primary Cardiologist (physician) and Advanced Practice Providers (APPs -  Physician Assistants and Nurse Practitioners) who all work together to provide you with the care you need, when you need it.  We recommend signing up for the patient portal called "MyChart".  Sign up information is provided on this After Visit Summary.  MyChart is used to connect with patients for Virtual Visits (Telemedicine).  Patients are able to view lab/test results, encounter notes, upcoming appointments, etc.  Non-urgent messages can be sent to your provider as well.   To learn more about what you can do with MyChart, go to [email or web address].    Your next appointment:   1 year(s)  The format for your next appointment:   In Person  Provider:   Reatha Harps, MD    Other Instructions

## 2023-06-14 ENCOUNTER — Encounter: Payer: Self-pay | Admitting: Cardiovascular Disease

## 2023-06-14 DIAGNOSIS — R931 Abnormal findings on diagnostic imaging of heart and coronary circulation: Secondary | ICD-10-CM

## 2023-06-14 MED ORDER — LISINOPRIL 40 MG PO TABS
40.0000 mg | ORAL_TABLET | Freq: Every day | ORAL | 2 refills | Status: DC
Start: 2023-06-14 — End: 2023-12-02

## 2023-07-14 DIAGNOSIS — Z9189 Other specified personal risk factors, not elsewhere classified: Secondary | ICD-10-CM | POA: Diagnosis not present

## 2023-07-14 DIAGNOSIS — G4733 Obstructive sleep apnea (adult) (pediatric): Secondary | ICD-10-CM | POA: Diagnosis not present

## 2023-07-14 DIAGNOSIS — R011 Cardiac murmur, unspecified: Secondary | ICD-10-CM | POA: Diagnosis not present

## 2023-07-14 DIAGNOSIS — I10 Essential (primary) hypertension: Secondary | ICD-10-CM | POA: Diagnosis not present

## 2023-07-14 DIAGNOSIS — Z6828 Body mass index (BMI) 28.0-28.9, adult: Secondary | ICD-10-CM | POA: Diagnosis not present

## 2023-07-14 DIAGNOSIS — R7303 Prediabetes: Secondary | ICD-10-CM | POA: Diagnosis not present

## 2023-07-14 DIAGNOSIS — E049 Nontoxic goiter, unspecified: Secondary | ICD-10-CM | POA: Diagnosis not present

## 2023-07-14 DIAGNOSIS — E782 Mixed hyperlipidemia: Secondary | ICD-10-CM | POA: Diagnosis not present

## 2023-07-14 DIAGNOSIS — E663 Overweight: Secondary | ICD-10-CM | POA: Diagnosis not present

## 2023-07-25 ENCOUNTER — Other Ambulatory Visit: Payer: Self-pay | Admitting: Family Medicine

## 2023-07-25 DIAGNOSIS — R7301 Impaired fasting glucose: Secondary | ICD-10-CM

## 2023-08-05 ENCOUNTER — Ambulatory Visit (INDEPENDENT_AMBULATORY_CARE_PROVIDER_SITE_OTHER): Payer: Medicare HMO | Admitting: Family Medicine

## 2023-08-05 DIAGNOSIS — Z0000 Encounter for general adult medical examination without abnormal findings: Secondary | ICD-10-CM

## 2023-08-05 NOTE — Progress Notes (Signed)
 PATIENT CHECK-IN and HEALTH RISK ASSESSMENT QUESTIONNAIRE:  -completed by phone/video for upcoming Medicare Preventive Visit    Pre-Visit Check-in: 1)Vitals (height, wt, BP, etc) - record in vitals section for visit on day of visit Request home vitals (wt, BP, etc.) and enter into vitals, THEN update Vital Signs SmartPhrase below at the top of the HPI. See below.  2)Review and Update Medications, Allergies PMH, Surgeries, Social history in Epic 3)Hospitalizations in the last year with date/reason? n  4)Review and Update Care Team (patient's specialists) in Epic 5) Complete PHQ9 in Epic  6) Complete Fall Screening in Epic 7)Review all Health Maintenance Due and order under PCP if not done.  Medicare Wellness Patient Questionnaire:  Answer theses question about your habits: How often do you have a drink containing alcohol?   2 glasses wine, has recently cut back and is not having withdrawal Have you ever smoked? Quit smoking in 2011 Do you use an illicit drugs?n On average, how many days per week do you engage in moderate to strenuous exercise (like a brisk walk)? Is active and busy, but does not get regular exercise Typical breakfast: was doing oats with vanilla yogurt, berries, recently started doing coffee with a protein supplement.  Typical lunch and dinner: marinated chicken and salad, soup; tuna salad, spinach salad with egg and bacon, leftovers - black bean cakes, [place] or chicken chili Typical snacks: reports does not snack - sometimes nuts, cheese, apple slices  Beverages: water, coffee, seltzer, tea, wine  Answer theses question about your everyday activities: Can you perform most household chores?y Are you deaf or have significant trouble hearing?n Do you feel that you have a problem with memory?n Do you feel safe at home?y Last dentist visit?goes on a regular basis 8. Do you have any difficulty performing your everyday activities?n Are you having any difficulty walking,  taking medications on your own, and or difficulty managing daily home needs?n Do you have difficulty walking or climbing stairs?n Do you have difficulty dressing or bathing?n Do you have difficulty doing errands alone such as visiting a doctor's office or shopping?n Do you currently have any difficulty preparing food and eating?n Do you currently have any difficulty using the toilet?n Do you have any difficulty managing your finances?n Do you have any difficulties with housekeeping of managing your housekeeping?n   Do you have Advanced Directives in place (Living Will, Healthcare Power or Attorney)? Living will   Last eye Exam and location? Goes on a regular basis   Do you currently use prescribed or non-prescribed narcotic or opioid pain medications?n  Do you have a history or close family history of breast, ovarian, tubal or peritoneal cancer or a family member with BRCA (breast cancer susceptibility 1 and 2) gene mutations?  Lots of cancer in the family - but genetic testing showed she was good    ----------------------------------------------------------------------------------------------------------------------------------------------------------------------------------------------------------------------  Because this visit was a virtual/telehealth visit, some criteria may be missing or patient reported. Any vitals not documented were not able to be obtained and vitals that have been documented are patient reported.    MEDICARE ANNUAL PREVENTIVE VISIT WITH PROVIDER: (Welcome to Medicare, initial annual wellness or annual wellness exam)  Virtual Visit via Video Note  I connected with Laura Payne on 08/05/23 by a video enabled telemedicine application and verified that I am speaking with the correct person using two identifiers.  Location patient: home Location provider:work or home office Persons participating in the virtual visit: patient, provider  Concerns and/or  follow up today: [facility], started zepbound, also is working on diet and exercise.    See HM section in Epic for other details of completed HM.    ROS: negative for report of fevers, unintentional weight loss, vision changes, vision loss, hearing loss or change, chest pain, sob, hemoptysis, melena, hematochezia, hematuria, falls, bleeding or bruising, thoughts of suicide or self harm, memory loss  Patient-completed extensive health risk assessment - reviewed and discussed with the patient: See Health Risk Assessment completed with patient prior to the visit either above or in recent phone note. This was reviewed in detailed with the patient today and appropriate recommendations, orders and referrals were placed as needed per Summary below and patient instructions.   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   Partial hamstring tear 03/2016   Sleep apnea    Suspected sleep apnea    never assessed/evaluated, but using SO's CPAP or BiPAP machine for years and feels better. Noted by MD only in 07/2019    Past Surgical History:  Procedure Laterality Date   DILATION AND CURETTAGE OF UTERUS     TONSILLECTOMY      Social History   Socioeconomic  History   Marital status: Divorced    Spouse name: Not on file   Number of children: 1   Years of education: Not on file   Highest education level: Master's degree (e.g., MA, MS, MEng, MEd, MSW, MBA)  Occupational History   Occupation: Scientist, research (life sciences): [employer]  Tobacco Use   Smoking status: Former    Current packs/day: 0.00    Average packs/day: 0.1 packs/day for 41.0 years (4.1 ttl pk-yrs)    Types: Cigarettes    Start date: 10/07/1972    Quit date: 10/07/2013    Years since quitting: 9.8   Smokeless tobacco: Never  Vaping Use   Vaping status: Never Used  Substance and Sexual Activity   Alcohol use: Yes    Alcohol/week: 14.0 standard drinks of alcohol    Types: 14 Glasses of wine per week    Comment: 2-3 glasses daily   Drug use: No   Sexual activity: Yes    Partners: Male    Birth control/protection: Post-menopausal  Other Topics Concern   Not on file  Social History Narrative   Has 1 son.   No pets.   Architect. 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    Monogamous, committed relationship.   [place] at the beach Thursdays through Tuesdays each week   Social Drivers of Health   Financial Resource Strain: Low Risk  (08/01/2023)  Overall Financial Resource Strain (CARDIA)    Difficulty of Paying Living Expenses: Not hard at all  Food Insecurity: No Food Insecurity (08/01/2023)   Hunger Vital Sign    Worried About Running Out of Food in the Last Year: Never true    Ran Out of Food in the Last Year: Never true  Transportation Needs: No Transportation Needs (08/01/2023)   PRAPARE - Administrator, Civil Service (Medical): No    Lack of Transportation (Non-Medical): No  Physical Activity: Insufficiently Active (08/01/2023)   Exercise Vital Sign    Days of Exercise per Week: 3 days    Minutes of Exercise per Session: 20 min  Stress: No Stress Concern Present (08/01/2023)   [employer] of Occupational  Health - Occupational Stress Questionnaire    Feeling of Stress : Only a little  Social Connections: Moderately Isolated (08/01/2023)   Social Connection and Isolation Panel [NHANES]    Frequency of Communication with Friends and Family: Three times a week    Frequency of Social Gatherings with Friends and Family: Once a week    Attends Religious Services: Never    Database administrator or Organizations: No    Attends Engineer, structural: Not on file    Marital Status: Living with partner  Intimate Partner Violence: Not on file    Family History  Problem Relation Age of Onset   Osteoporosis Mother    Dementia Mother    Transient ischemic attack Mother    Hypertension Mother    Heart disease Mother        atrial fibrillation   Atrial fibrillation Mother    Ovarian cancer Mother    Cancer Mother        ovarian   Cancer Father 32       osteosarcoma, metastasized to lung   Early death Father    Crohn's disease Cousin    Psoriasis Cousin    Diabetes Maternal Uncle    Cancer Paternal Uncle 39       colon   Cancer Maternal Grandfather        throat   Breast cancer Paternal Grandmother 75   Arthritis Cousin        rheumatoid   Diabetes Cousin        type 1   Breast cancer Cousin        and uterine cancer--Lynch syndrome    Current Outpatient Medications on File Prior to Visit  Medication Sig Dispense Refill   tirzepatide 5 MG/0.5ML injection vial Inject 5 mg into the skin once a week.     Cholecalciferol (VITAMIN D) 2000 units CAPS Take 1 capsule by mouth.     Coenzyme Q10 (COQ10 PO) Take 1 capsule by mouth daily.     esomeprazole (NEXIUM) 20 MG capsule Take 20 mg by mouth daily at 12 noon.      ibuprofen (ADVIL,MOTRIN) 200 MG tablet Take 400 mg by mouth every 6 (six) hours as needed.     lisinopril (ZESTRIL) 40 MG tablet Take 1 tablet (40 mg total) by mouth daily. 90 tablet 2   metFORMIN (GLUCOPHAGE) 500 MG tablet TAKE ONE-HALF TABLET(250 MG) BY MOUTH TWICE  DAILY WITH A MEAL 90 tablet 2   rosuvastatin (CRESTOR) 10 MG tablet Take 1 tablet (10 mg total) by mouth daily. 90 tablet 3   vitamin B-12 (CYANOCOBALAMIN) 1000 MCG tablet Take 1,000 mcg by mouth daily.     No current facility-administered medications on file prior  to visit.    Allergies  Allergen Reactions   Codeine Other (See Comments)    Would make her start her period. Also just affected her body poorly so she just does not take it.   Hctz [Hydrochlorothiazide] Other (See Comments)    Hyponatremia    Latex Rash   Penicillins Rash       Physical Exam Vitals requested from patient and listed below if patient had equipment and was able to obtain at home for this virtual visit: There were no vitals filed for this visit. Estimated body mass index is 29.45 kg/m as calculated from the following:   Height as of 05/18/23: 5\' 5"  (1.651 m).   Weight as of 05/18/23: 177 lb (80.3 kg).  EKG (optional): deferred due to virtual visit  GENERAL: alert, oriented, no acute distress detected, full vision exam deferred due to pandemic and/or virtual encounter  HEENT: atraumatic, conjunttiva clear, no obvious abnormalities on inspection of external nose and ears  NECK: normal movements of the head and neck  LUNGS: on inspection no signs of respiratory distress, breathing rate appears normal, no obvious gross SOB, gasping or wheezing  CV: no obvious cyanosis  MS: moves all visible extremities without noticeable abnormality  PSYCH/NEURO: pleasant and cooperative, no obvious depression or anxiety, speech and thought processing grossly intact, Cognitive function grossly intact        10/07/2022   10:28 AM 09/09/2020    1:37 PM 08/16/2019    8:48 AM 02/15/2019   10:24 AM 09/07/2016   10:12 AM  Depression screen PHQ 2/9  Decreased Interest 0 0 0 0 0  Down, Depressed, Hopeless 0 0 0 0 0  PHQ - 2 Score 0 0 0 0 0       09/09/2020    1:37 PM 06/02/2021    3:41 PM 10/07/2022   10:28 AM  Fall  Risk  Falls in the past year? 1 0 0  Was there an injury with Fall? 0 0 0  Fall Risk Category Calculator 1 0 0  Fall Risk Category (Retired) Low Low   (RETIRED) Patient Fall Risk Level  Low fall risk   Patient at Risk for Falls Due to No Fall Risks;Other (Comment) No Fall Risks Other (Comment)  Patient at Risk for Falls Due to - Comments slipped on ice    Fall risk Follow up Falls evaluation completed Falls evaluation completed Falls evaluation completed     SUMMARY AND PLAN:  Encounter for Medicare annual wellness exam   Discussed applicable health maintenance/preventive health measures and advised and referred or ordered per patient preferences: -reports gets her mammograms and bone density tests with Dr. Renaldo Fiddler, reports gets yearly and agrees to request that copy be sent to the Dr. [place]. I requested most recent copy as well.  -discussed vaccines due/risks, she knows can get at the pharmacy if she decides to do Health Maintenance  Topic Date Due   Pneumonia Vaccine 35+ Years old (1 of 1 - PCV) Never done   Zoster Vaccines- Shingrix (1 of 2) Never done   MAMMOGRAM  01/31/2022   COVID-19 Vaccine (5 - 2024-25 season) 12/20/2022   Fecal DNA (Cologuard)  10/07/2026 (Originally 11/08/2021)   INFLUENZA VACCINE  11/19/2023   Medicare Annual Wellness (AWV)  08/04/2024   DTaP/Tdap/Td (3 - Td or Tdap) 02/14/2029   DEXA SCAN  Completed   Hepatitis C Screening  Completed   HPV VACCINES  Aged Out   Meningococcal B Vaccine  Aged Out  Colonoscopy  Discontinued      Education and counseling on the following was provided based on the above review of health and a plan/checklist for the patient, along with additional information discussed, was provided for the patient in the patient instructions :  -Advised on importance of completing advanced directives, discussed options for completing and provided information in patient instructions as well -Advised and counseled on a healthy lifestyle -  including the importance of a healthy diet, regular physical activity, social connections and stress management. -Reviewed patient's current diet. Advised and counseled on a whole foods based healthy diet. A summary of a healthy diet was provided in the Patient Instructions.  -reviewed patient's current physical activity level and discussed exercise guidelines for adults. Discussed community resources and ideas for safe exercise at home to assist in meeting exercise guideline recommendations in a safe and healthy way.  -Advise yearly dental visits at minimum and regular eye exams -Advised and counseled on alcohol safe limits, risks; she reports has been able to quit briefly without issue in the past, has not had trouble cutting back and feels can completely quit and seems to be motivated to do so. Advised no more than 1 drink per day and advised to schedule appt if any difficulty cutting back or eliminating.   Follow up: see patient instructions     Patient Instructions  I really enjoyed getting to talk with you today! I am available on Tuesdays and Thursdays for virtual visits if you have any questions or concerns, or if I can be of any further assistance.   CHECKLIST FROM ANNUAL WELLNESS VISIT:  -Follow up (please call to schedule if not scheduled after visit):   -yearly for annual wellness visit with primary care office  Here is a list of your preventive care/health maintenance measures and the plan for each if any are due:  PLAN For any measures below that may be due:  -please obtain your mammogram and bone density reports for Dr. Swaziland or request that they be sent to her. Thanks!  -if you wish to do the vaccine, please let us know once done so that we can update your records.   Health Maintenance  Topic Date Due   Pneumonia Vaccine 55+ Years old (1 of 1 - PCV) Never done   Zoster Vaccines- Shingrix (1 of 2) Never done   MAMMOGRAM  01/31/2022   COVID-19 Vaccine (5 - 2024-25 season)  12/20/2022   Fecal DNA (Cologuard)  10/07/2026 (Originally 11/08/2021)   INFLUENZA VACCINE  11/19/2023   Medicare Annual Wellness (AWV)  08/04/2024   DTaP/Tdap/Td (3 - Td or Tdap) 02/14/2029   DEXA SCAN  Completed   Hepatitis C Screening  Completed   HPV VACCINES  Aged Out   Meningococcal B Vaccine  Aged Out   Colonoscopy  Discontinued    -See a dentist at least yearly  -Get your eyes checked and then per your eye specialist's recommendations  -Other issues addressed today:   -I have included below further information regarding a healthy whole foods based diet, physical activity guidelines for adults, stress management and opportunities for social connections. I hope you find this information useful.   -----------------------------------------------------------------------------------------------------------------------------------------------------------------------------------------------------------------------------------------------------------    NUTRITION: -eat real food: lots of colorful vegetables (half the plate) and fruits -5-7 servings of vegetables and fruits per day (fresh or steamed is best), exp. 2 servings of vegetables with lunch and dinner and 2 servings of fruit per day. Berries and greens such as kale and collards  are great choices.  -consume on a regular basis:  fresh fruits, fresh veggies, fish, nuts, seeds, healthy oils (such as olive oil, avocado oil), whole grains (make sure for bread/pasta/crackers/etc., that the first ingredient on label contains the word "whole"), legumes. -can eat small amounts of dairy and lean meat (no larger than the palm of your hand), but avoid processed meats such as ham, bacon, lunch meat, etc. -drink water -try to avoid fast food and pre-packaged foods, processed meat, ultra processed foods/beverages (donuts, candy, etc.) -most experts advise limiting sodium to < 2300mg  per day, should limit further is any chronic conditions such  as high blood pressure, heart disease, diabetes, etc. The American Heart Association advised that < 1500mg  is is ideal -try to avoid foods/beverages that contain any ingredients with names you do not recognize  -try to avoid foods/beverages  with added sugar or sweeteners/sweets  -try to avoid sweet drinks (including diet drinks): soda, juice, Gatorade, sweet tea, power drinks, diet drinks -try to avoid white rice, white bread, pasta (unless whole grain)  EXERCISE GUIDELINES FOR ADULTS: -if you wish to increase your physical activity, do so gradually and with the approval of your doctor -STOP and seek medical care immediately if you have any chest pain, chest discomfort or trouble breathing when starting or increasing exercise  -move and stretch your body, legs, feet and arms when sitting for long periods -Physical activity guidelines for optimal health in adults: -get at least 150 minutes per week of moderate exercise (can talk, but not sing); this is about 20-30 minutes of sustained activity 5-7 days per week or two 10-15 minute episodes of sustained activity 5-7 days per week -do some muscle building/resistance training/strength training at least 2 days per week  -balance exercises 3+ days per week:   Stand somewhere where you have something sturdy to hold onto if you lose balance    1) lift up on toes, then back down, start with 5x per day and work up to 20x   2) stand and lift one leg straight out to the side so that foot is a few inches of the floor, start with 5x each side and work up to 20x each side   3) stand on one foot, start with 5 seconds each side and work up to 20 seconds on each side  If you need ideas or help with getting more active:  -Silver sneakers https://tools.silversneakers.com  -Walk with a Doc: http://www.duncan-williams.com/  -try to include resistance (weight lifting/strength building) and balance exercises twice per week: or the following link for  ideas: http://castillo-powell.com/  BuyDucts.dk  STRESS MANAGEMENT: -can try meditating, or just sitting quietly with deep breathing while intentionally relaxing all parts of your body for 5 minutes daily -if you need further help with stress, anxiety or depression please follow up with your primary doctor or contact the wonderful folks at [facility]: [phone]  SOCIAL CONNECTIONS: -options in [place] if you wish to engage in more social and exercise related activities:  -Silver sneakers https://tools.silversneakers.com  -Walk with a Doc: http://www.duncan-williams.com/  -Check out the [facility] Active Adults 50+ section on the [place] of [employer] (hiking clubs, book clubs, cards and games, chess, exercise classes, aquatic classes and much more) - see the website for details: https://www.[place].gov/departments/parks-recreation/active-adults50  -YouTube has lots of exercise videos for different ages and abilities as well  -[facility] (a variety of indoor and outdoor inperson activities for adults). [phone]. [address].  -Virtual Online Classes (a  variety of topics): see seniorplanet.org or call [phone]  -consider volunteering at a school, hospice center, church, senior center or elsewhere  ADVANCED HEALTHCARE DIRECTIVES:  [place] Advanced Directives assistance:   ExpressWeek.com.cy  Everyone should have advanced health care directives in place. This is so that you get the care you want, should you ever be in a situation where you are unable to make your own medical decisions.   From the [place] Advanced Directive Website: "Advance Health Care Directives are legal documents in which you give written instructions about your health care if, in the future, you cannot speak for yourself.    A health care power of attorney allows you to name a person you trust to make your health care decisions if you cannot make them yourself. A declaration of a desire for a natural death (or living will) is document, which states that you desire not to have your life prolonged by extraordinary measures if you have a terminal or incurable illness or if you are in a vegetative state. An advance instruction for mental health treatment makes a declaration of instructions, information and preferences regarding your mental health treatment. It also states that you are aware that the advance instruction authorizes a mental health treatment provider to act according to your wishes. It may also outline your consent or refusal of mental health treatment. A declaration of an anatomical gift allows anyone over the age of 55 to make a gift by will, organ donor card or other document."   Please see the following website or an elder law attorney for forms, FAQs and for completion of advanced directives: [place]  Print production planner Health Care Directives Advance Health Care Directives (http://guzman.com/)  Or copy and paste the following to your web browser: PoshChat.[email or web address]            Maurie Southern, DO

## 2023-08-05 NOTE — Patient Instructions (Signed)
 I really enjoyed getting to talk with you today! I am available on Tuesdays and Thursdays for virtual visits if you have any questions or concerns, or if I can be of any further assistance.   CHECKLIST FROM ANNUAL WELLNESS VISIT:  -Follow up (please call to schedule if not scheduled after visit):   -yearly for annual wellness visit with primary care office  Here is a list of your preventive care/health maintenance measures and the plan for each if any are due:  PLAN For any measures below that may be due:  -please obtain your mammogram and bone density reports for Dr. Swaziland or request that they be sent to her. Thanks!  -if you wish to do the vaccine, please let us know once done so that we can update your records.   Health Maintenance  Topic Date Due   Pneumonia Vaccine 15+ Years old (1 of 1 - PCV) Never done   Zoster Vaccines- Shingrix (1 of 2) Never done   MAMMOGRAM  01/31/2022   COVID-19 Vaccine (5 - 2024-25 season) 12/20/2022   Fecal DNA (Cologuard)  10/07/2026 (Originally 11/08/2021)   INFLUENZA VACCINE  11/19/2023   Medicare Annual Wellness (AWV)  08/04/2024   DTaP/Tdap/Td (3 - Td or Tdap) 02/14/2029   DEXA SCAN  Completed   Hepatitis C Screening  Completed   HPV VACCINES  Aged Out   Meningococcal B Vaccine  Aged Out   Colonoscopy  Discontinued    -See a dentist at least yearly  -Get your eyes checked and then per your eye specialist's recommendations  -Other issues addressed today:   -I have included below further information regarding a healthy whole foods based diet, physical activity guidelines for adults, stress management and opportunities for social connections. I hope you find this information useful.    -----------------------------------------------------------------------------------------------------------------------------------------------------------------------------------------------------------------------------------------------------------    NUTRITION: -eat real food: lots of colorful vegetables (half the plate) and fruits -5-7 servings of vegetables and fruits per day (fresh or steamed is best), exp. 2 servings of vegetables with lunch and dinner and 2 servings of fruit per day. Berries and greens such as kale and collards are great choices.  -consume on a regular basis:  fresh fruits, fresh veggies, fish, nuts, seeds, healthy oils (such as olive oil, avocado oil), whole grains (make sure for bread/pasta/crackers/etc., that the first ingredient on label contains the word "whole"), legumes. -can eat small amounts of dairy and lean meat (no larger than the palm of your hand), but avoid processed meats such as ham, bacon, lunch meat, etc. -drink water -try to avoid fast food and pre-packaged foods, processed meat, ultra processed foods/beverages (donuts, candy, etc.) -most experts advise limiting sodium to < 2300mg  per day, should limit further is any chronic conditions such as high blood pressure, heart disease, diabetes, etc. The American Heart Association advised that < 1500mg  is is ideal -try to avoid foods/beverages that contain any ingredients with names you do not recognize  -try to avoid foods/beverages  with added sugar or sweeteners/sweets  -try to avoid sweet drinks (including diet drinks): soda, juice, Gatorade, sweet tea, power drinks, diet drinks -try to avoid white rice, white bread, pasta (unless whole grain)  EXERCISE GUIDELINES FOR ADULTS: -if you wish to increase your physical activity, do so gradually and with the approval of your doctor -STOP and seek medical care immediately if you have any chest pain, chest discomfort or trouble breathing when starting or  increasing exercise  -move and stretch your body, legs, feet and arms  when sitting for long periods -Physical activity guidelines for optimal health in adults: -get at least 150 minutes per week of moderate exercise (can talk, but not sing); this is about 20-30 minutes of sustained activity 5-7 days per week or two 10-15 minute episodes of sustained activity 5-7 days per week -do some muscle building/resistance training/strength training at least 2 days per week  -balance exercises 3+ days per week:   Stand somewhere where you have something sturdy to hold onto if you lose balance    1) lift up on toes, then back down, start with 5x per day and work up to 20x   2) stand and lift one leg straight out to the side so that foot is a few inches of the floor, start with 5x each side and work up to 20x each side   3) stand on one foot, start with 5 seconds each side and work up to 20 seconds on each side  If you need ideas or help with getting more active:  -Silver sneakers https://tools.silversneakers.com  -Walk with a Doc: http://www.duncan-williams.com/  -try to include resistance (weight lifting/strength building) and balance exercises twice per week: or the following link for ideas: http://castillo-powell.com/  BuyDucts.dk  STRESS MANAGEMENT: -can try meditating, or just sitting quietly with deep breathing while intentionally relaxing all parts of your body for 5 minutes daily -if you need further help with stress, anxiety or depression please follow up with your primary doctor or contact the wonderful folks at [facility]: [phone]  SOCIAL CONNECTIONS: -options in [place] if you wish to engage in more social and exercise related activities:  -Silver sneakers https://tools.silversneakers.com  -Walk with a Doc: http://www.duncan-williams.com/  -Check out the [facility] Active Adults 50+  section on the [place] of [employer] (hiking clubs, book clubs, cards and games, chess, exercise classes, aquatic classes and much more) - see the website for details: https://www.[place].gov/departments/parks-recreation/active-adults50  -YouTube has lots of exercise videos for different ages and abilities as well  -[facility] (a variety of indoor and outdoor inperson activities for adults). [phone]. [address]  -Virtual Online Classes (a variety of topics): see seniorplanet.org or call [phone]  -consider volunteering at a school, hospice center, church, senior center or elsewhere  ADVANCED HEALTHCARE DIRECTIVES:  [place] Advanced Directives assistance:   ExpressWeek.com.cy  Everyone should have advanced health care directives in place. This is so that you get the care you want, should you ever be in a situation where you are unable to make your own medical decisions.   From the [place] Advanced Directive Website: "Advance Health Care Directives are legal documents in which you give written instructions about your health care if, in the future, you cannot speak for yourself.   A health care power of attorney allows you to name a person you trust to make your health care decisions if you cannot make them yourself. A declaration of a desire for a natural death (or living will) is document, which states that you desire not to have your life prolonged by extraordinary measures if you have a terminal or incurable illness or if you are in a vegetative state. An advance instruction for mental health treatment makes a declaration of instructions, information and preferences regarding your mental health treatment. It also states that you are aware that the advance instruction authorizes a mental health treatment provider to act according to your wishes. It may also outline your consent or refusal of mental  health treatment. A declaration of  an anatomical gift allows anyone over the age of 41 to make a gift by will, organ donor card or other document."   Please see the following website or an elder law attorney for forms, FAQs and for completion of advanced directives: [place]  Print production planner Health Care Directives Advance Health Care Directives (http://guzman.[email or web address]/)  Or copy and paste the following to your web browser: PoshChat.fi

## 2023-09-04 DIAGNOSIS — H9201 Otalgia, right ear: Secondary | ICD-10-CM | POA: Diagnosis not present

## 2023-09-04 DIAGNOSIS — T161XXA Foreign body in right ear, initial encounter: Secondary | ICD-10-CM | POA: Diagnosis not present

## 2023-09-04 DIAGNOSIS — S00451A Superficial foreign body of right ear, initial encounter: Secondary | ICD-10-CM | POA: Diagnosis not present

## 2023-09-04 DIAGNOSIS — Z87891 Personal history of nicotine dependence: Secondary | ICD-10-CM | POA: Diagnosis not present

## 2023-09-04 DIAGNOSIS — W44F4XA Insect entering into or through a natural orifice, initial encounter: Secondary | ICD-10-CM | POA: Diagnosis not present

## 2023-09-07 DIAGNOSIS — S00451A Superficial foreign body of right ear, initial encounter: Secondary | ICD-10-CM | POA: Diagnosis not present

## 2023-10-06 DIAGNOSIS — Z6825 Body mass index (BMI) 25.0-25.9, adult: Secondary | ICD-10-CM | POA: Diagnosis not present

## 2023-10-06 DIAGNOSIS — E782 Mixed hyperlipidemia: Secondary | ICD-10-CM | POA: Diagnosis not present

## 2023-10-06 DIAGNOSIS — G4733 Obstructive sleep apnea (adult) (pediatric): Secondary | ICD-10-CM | POA: Diagnosis not present

## 2023-10-06 DIAGNOSIS — E049 Nontoxic goiter, unspecified: Secondary | ICD-10-CM | POA: Diagnosis not present

## 2023-10-06 DIAGNOSIS — I10 Essential (primary) hypertension: Secondary | ICD-10-CM | POA: Diagnosis not present

## 2023-10-06 DIAGNOSIS — M791 Myalgia, unspecified site: Secondary | ICD-10-CM | POA: Diagnosis not present

## 2023-10-06 DIAGNOSIS — R011 Cardiac murmur, unspecified: Secondary | ICD-10-CM | POA: Diagnosis not present

## 2023-10-06 DIAGNOSIS — E663 Overweight: Secondary | ICD-10-CM | POA: Diagnosis not present

## 2023-10-06 DIAGNOSIS — R7303 Prediabetes: Secondary | ICD-10-CM | POA: Diagnosis not present

## 2023-10-17 ENCOUNTER — Other Ambulatory Visit: Payer: Self-pay | Admitting: Family Medicine

## 2023-11-11 ENCOUNTER — Other Ambulatory Visit: Payer: Self-pay | Admitting: Family Medicine

## 2023-11-17 DIAGNOSIS — G4733 Obstructive sleep apnea (adult) (pediatric): Secondary | ICD-10-CM | POA: Diagnosis not present

## 2023-11-17 DIAGNOSIS — I10 Essential (primary) hypertension: Secondary | ICD-10-CM | POA: Diagnosis not present

## 2023-11-17 DIAGNOSIS — Z6825 Body mass index (BMI) 25.0-25.9, adult: Secondary | ICD-10-CM | POA: Diagnosis not present

## 2023-11-17 DIAGNOSIS — E782 Mixed hyperlipidemia: Secondary | ICD-10-CM | POA: Diagnosis not present

## 2023-11-17 DIAGNOSIS — R7303 Prediabetes: Secondary | ICD-10-CM | POA: Diagnosis not present

## 2023-11-17 DIAGNOSIS — R011 Cardiac murmur, unspecified: Secondary | ICD-10-CM | POA: Diagnosis not present

## 2023-11-17 DIAGNOSIS — E663 Overweight: Secondary | ICD-10-CM | POA: Diagnosis not present

## 2023-11-17 DIAGNOSIS — E049 Nontoxic goiter, unspecified: Secondary | ICD-10-CM | POA: Diagnosis not present

## 2023-11-17 DIAGNOSIS — M791 Myalgia, unspecified site: Secondary | ICD-10-CM | POA: Diagnosis not present

## 2023-11-17 DIAGNOSIS — D649 Anemia, unspecified: Secondary | ICD-10-CM | POA: Diagnosis not present

## 2023-11-30 ENCOUNTER — Other Ambulatory Visit: Payer: Self-pay | Admitting: Cardiovascular Disease

## 2023-11-30 DIAGNOSIS — R931 Abnormal findings on diagnostic imaging of heart and coronary circulation: Secondary | ICD-10-CM

## 2023-12-13 ENCOUNTER — Ambulatory Visit: Payer: Self-pay

## 2023-12-13 NOTE — Telephone Encounter (Signed)
 FYI Only or Action Required?: FYI only for provider.  Patient was last seen in primary care on 08/05/2023 by Luke Chiquita SAUNDERS, DO.  Called Nurse Triage reporting Chest Pain.  Symptoms began Saturday.  Interventions attempted: Nothing.  Symptoms are: unchanged.  Triage Disposition: See Physician Within 24 Hours  Patient/caregiver understands and will follow disposition?: Yes     Reason for Disposition  [1] Chest pain lasts > 5 minutes AND [2] occurred > 3 days ago (72 hours) AND [3] NO chest pain or cardiac symptoms now  Answer Assessment - Initial Assessment Questions 1. LOCATION: Where does it hurt?       Chest pain center chest 2. RADIATION: Does the pain go anywhere else? (e.g., into neck, jaw, arms, back)     Back and bilateral arms 3. ONSET: When did the chest pain begin? (Minutes, hours or days)      Saturday 4. PATTERN: Does the pain come and go, or has it been constant since it started?  Does it get worse with exertion?      Comes and goes 5. DURATION: How long does it last (e.g., seconds, minutes, hours)     na 6. SEVERITY: How bad is the pain?  (e.g., Scale 1-10; mild, moderate, or severe)     6 to 2/10 7. CARDIAC RISK FACTORS: Do you have any history of heart problems or risk factors for heart disease? (e.g., angina, prior heart attack; diabetes, high blood pressure, high cholesterol, smoker, or strong family history of heart disease)     na 8. PULMONARY RISK FACTORS: Do you have any history of lung disease?  (e.g., blood clots in lung, asthma, emphysema, birth control pills)     na 9. CAUSE: What do you think is causing the chest pain?     unknown 10. OTHER SYMPTOMS: Do you have any other symptoms? (e.g., dizziness, nausea, vomiting, sweating, fever, difficulty breathing, cough)       Bloating, n/v 11. PREGNANCY: Is there any chance you are pregnant? When was your last menstrual period?       na  Protocols used: Chest Pain-A-AH

## 2023-12-14 ENCOUNTER — Ambulatory Visit (INDEPENDENT_AMBULATORY_CARE_PROVIDER_SITE_OTHER): Admitting: Family Medicine

## 2023-12-14 ENCOUNTER — Inpatient Hospital Stay: Admitting: Family Medicine

## 2023-12-14 ENCOUNTER — Encounter: Payer: Self-pay | Admitting: Family Medicine

## 2023-12-14 VITALS — BP 120/78 | HR 75 | Temp 98.6°F | Ht 65.0 in | Wt 156.3 lb

## 2023-12-14 DIAGNOSIS — K219 Gastro-esophageal reflux disease without esophagitis: Secondary | ICD-10-CM

## 2023-12-14 DIAGNOSIS — E782 Mixed hyperlipidemia: Secondary | ICD-10-CM

## 2023-12-14 DIAGNOSIS — R0789 Other chest pain: Secondary | ICD-10-CM

## 2023-12-14 LAB — COMPREHENSIVE METABOLIC PANEL WITH GFR
ALT: 19 U/L (ref 0–35)
AST: 18 U/L (ref 0–37)
Albumin: 4.8 g/dL (ref 3.5–5.2)
Alkaline Phosphatase: 58 U/L (ref 39–117)
BUN: 11 mg/dL (ref 6–23)
CO2: 24 meq/L (ref 19–32)
Calcium: 9.6 mg/dL (ref 8.4–10.5)
Chloride: 104 meq/L (ref 96–112)
Creatinine, Ser: 0.69 mg/dL (ref 0.40–1.20)
GFR: 90.21 mL/min (ref >60.00)
Glucose, Bld: 85 mg/dL (ref 70–99)
Potassium: 4.2 meq/L (ref 3.5–5.1)
Sodium: 141 meq/L (ref 135–145)
Total Bilirubin: 0.5 mg/dL (ref 0.2–1.2)
Total Protein: 7 g/dL (ref 6.0–8.3)

## 2023-12-14 LAB — LIPASE: Lipase: 97 U/L — ABNORMAL HIGH (ref 11.0–59.0)

## 2023-12-14 MED ORDER — PRAVASTATIN SODIUM 20 MG PO TABS
20.0000 mg | ORAL_TABLET | Freq: Every day | ORAL | 1 refills | Status: AC
Start: 2023-12-14 — End: ?

## 2023-12-14 NOTE — Progress Notes (Signed)
 Acute Office Visit  Subjective:     Patient ID: Laura Payne, female    DOB: 1955-06-18, 68 y.o.   MRN: [id number]  Chief Complaint  Patient presents with   Chest Pain    X days, noticed sharp pain in the center of her chest while cooking, did not seek ER treatment   Neck Pain    X2 weeks   Shoulder Pain    X2 weeks    Discussed the use of AI scribe software for clinical note transcription with the patient, who gave verbal consent to proceed.  History of Present Illness Laura Payne is a 68 year old female who presents with chest pain.Approximately two to three weeks ago, she began experiencing severe pain in her neck, shoulders, and arms, requiring four Advil daily. The pain persists but has decreased in intensity, now described as achy. No specific incident of heavy lifting or strenuous activity triggered the pain.On Saturday, while cooking dinner, she experienced a sudden, sharp pain in the center of her chest, rated as seven out of ten, which subsided after sitting down and drinking water. The following morning, the pain returned while she was in bed, rated as three out of ten, lasting a couple of hours. On Monday morning, the pain recurred again while she was in bed, rated as two out of ten, with a persistent tender spot in her chest throughout the day. She describes a 'really weird chest condition' but is unsure if it is related.She has a history of acid reflux and takes medication for it. Drinking Diet Coke usually causes a painful gas bubble, but she did not consume any over the weekend. She describes the recent chest pain as different from her usual reflux symptoms.She has been taking Zepbound for the past five months, with recent difficulties in obtaining the medication, leading to intermittent use. She reports increased nausea since starting Zepbound, with an episode of vomiting on Monday morning after taking the medication on Sunday. She attributes the vomiting to a  combination of nausea and a hard cough. She has noticed an improvement in her bowel habits, now having regular bowel movements once a day, compared to previous frequent diarrhea.    Neck Pain  Pertinent negatives include no fever.     Review of Systems  Constitutional:  Negative for chills and fever.  Respiratory:  Negative for shortness of breath.   Cardiovascular:  Negative for palpitations and leg swelling.  Gastrointestinal:  Positive for heartburn and vomiting. Negative for abdominal pain and nausea.  All other systems reviewed and are negative.       Objective:    BP 120/78   Pulse 75   Temp 98.6 F (37 C) (Oral)   Ht 5' 5 (1.651 m)   Wt 156 lb 4.8 oz (70.9 kg)   SpO2 98%   BMI 26.01 kg/m    Physical Exam Vitals reviewed.  Constitutional:      Appearance: Normal appearance. She is well-groomed and normal weight.  Cardiovascular:     Rate and Rhythm: Normal rate and regular rhythm.     Pulses: Normal pulses.     Heart sounds: S1 normal and S2 normal.  Pulmonary:     Effort: Pulmonary effort is normal.     Breath sounds: Normal breath sounds and air entry.  Abdominal:     General: Bowel sounds are normal.  Musculoskeletal:     Right lower leg: No edema.     Left lower leg: No  edema.  Neurological:     Mental Status: She is alert and oriented to person, place, and time. Mental status is at baseline.     Gait: Gait is intact.  Psychiatric:        Mood and Affect: Mood and affect normal.        Speech: Speech normal.        Behavior: Behavior normal.     No results found for any visits on 12/14/23.      Assessment & Plan:   Problem List Items Addressed This Visit       Unprioritized   GERD (gastroesophageal reflux disease)   Mixed hyperlipidemia   LDL was >200 last year, she reports she stopped taking the crestor  due to severe muscle pain, she tried coenzyme Q10 but it didn't really help the muscle pain. We discussed that LDL >200 is a  significant risk factor for CAD, I suggested a different statin to see if she would tolerate this one. Rx sent for pravastatin , she will follow up in December with Dr. Swaziland      Relevant Medications   pravastatin  (PRAVACHOL ) 20 MG tablet   Other Visit Diagnoses       Atypical chest pain    -  Primary   Relevant Orders   CMP   Lipase   Troponin I     Assessment and Plan    Chest pain Intermittent sharp chest pain, likely non-cardiac due to absence of typical cardiac symptoms and history of GERD.  Neck, shoulder, and arm myalgia Chronic myalgia with decreased severity, no clear precipitating event, possibly related to heavy lifting.  Gastroesophageal reflux disease (GERD) GERD with current management, potential contributor to recent chest pain.  Nausea and vomiting likely related to Zepbound therapy Nausea and vomiting likely due to Zepbound, especially after dose increase.  Obesity on Zepbound therapy Obesity management with Zepbound, recent dosing issues due to medication availability.        Meds ordered this encounter  Medications   pravastatin  (PRAVACHOL ) 20 MG tablet    Sig: Take 1 tablet (20 mg total) by mouth daily.    Dispense:  90 tablet    Refill:  1    No follow-ups on file.  Heron CHRISTELLA Sharper, MD

## 2023-12-14 NOTE — Assessment & Plan Note (Signed)
 LDL was >200 last year, she reports she stopped taking the crestor  due to severe muscle pain, she tried coenzyme Q10 but it didn't really help the muscle pain. We discussed that LDL >200 is a significant risk factor for CAD, I suggested a different statin to see if she would tolerate this one. Rx sent for pravastatin , she will follow up in December with Dr. Swaziland

## 2023-12-15 ENCOUNTER — Ambulatory Visit: Payer: Self-pay | Admitting: Family Medicine

## 2023-12-15 ENCOUNTER — Other Ambulatory Visit: Payer: Self-pay | Admitting: Obstetrics and Gynecology

## 2023-12-15 DIAGNOSIS — Z1231 Encounter for screening mammogram for malignant neoplasm of breast: Secondary | ICD-10-CM

## 2023-12-15 DIAGNOSIS — R748 Abnormal levels of other serum enzymes: Secondary | ICD-10-CM

## 2023-12-15 DIAGNOSIS — R1013 Epigastric pain: Secondary | ICD-10-CM

## 2023-12-15 LAB — TROPONIN I: Troponin I: <3 ng/L (ref <=47)

## 2023-12-17 ENCOUNTER — Ambulatory Visit
Admission: RE | Admit: 2023-12-17 | Discharge: 2023-12-17 | Disposition: A | Discharge status: Home / Self Care | Source: Ambulatory Visit | Attending: Family Medicine | Admitting: Family Medicine

## 2023-12-17 DIAGNOSIS — R748 Abnormal levels of other serum enzymes: Secondary | ICD-10-CM

## 2023-12-17 DIAGNOSIS — K760 Fatty (change of) liver, not elsewhere classified: Secondary | ICD-10-CM | POA: Diagnosis not present

## 2023-12-17 DIAGNOSIS — R1013 Epigastric pain: Secondary | ICD-10-CM

## 2023-12-23 ENCOUNTER — Ambulatory Visit

## 2023-12-25 DIAGNOSIS — G959 Disease of spinal cord, unspecified: Secondary | ICD-10-CM | POA: Diagnosis not present

## 2023-12-28 ENCOUNTER — Ambulatory Visit: Payer: Self-pay | Admitting: Family Medicine

## 2023-12-29 DIAGNOSIS — Z6824 Body mass index (BMI) 24.0-24.9, adult: Secondary | ICD-10-CM | POA: Diagnosis not present

## 2023-12-29 DIAGNOSIS — M791 Myalgia, unspecified site: Secondary | ICD-10-CM | POA: Diagnosis not present

## 2023-12-29 DIAGNOSIS — E049 Nontoxic goiter, unspecified: Secondary | ICD-10-CM | POA: Diagnosis not present

## 2023-12-29 DIAGNOSIS — E663 Overweight: Secondary | ICD-10-CM | POA: Diagnosis not present

## 2023-12-29 DIAGNOSIS — R011 Cardiac murmur, unspecified: Secondary | ICD-10-CM | POA: Diagnosis not present

## 2023-12-29 DIAGNOSIS — G4733 Obstructive sleep apnea (adult) (pediatric): Secondary | ICD-10-CM | POA: Diagnosis not present

## 2023-12-29 DIAGNOSIS — R7303 Prediabetes: Secondary | ICD-10-CM | POA: Diagnosis not present

## 2023-12-29 DIAGNOSIS — I10 Essential (primary) hypertension: Secondary | ICD-10-CM | POA: Diagnosis not present

## 2023-12-29 DIAGNOSIS — R748 Abnormal levels of other serum enzymes: Secondary | ICD-10-CM | POA: Diagnosis not present

## 2023-12-29 DIAGNOSIS — E782 Mixed hyperlipidemia: Secondary | ICD-10-CM | POA: Diagnosis not present

## 2023-12-29 DIAGNOSIS — D649 Anemia, unspecified: Secondary | ICD-10-CM | POA: Diagnosis not present

## 2024-02-28 DIAGNOSIS — R011 Cardiac murmur, unspecified: Secondary | ICD-10-CM | POA: Diagnosis not present

## 2024-02-28 DIAGNOSIS — I10 Essential (primary) hypertension: Secondary | ICD-10-CM | POA: Diagnosis not present

## 2024-02-28 DIAGNOSIS — L659 Nonscarring hair loss, unspecified: Secondary | ICD-10-CM | POA: Diagnosis not present

## 2024-02-28 DIAGNOSIS — Z6824 Body mass index (BMI) 24.0-24.9, adult: Secondary | ICD-10-CM | POA: Diagnosis not present

## 2024-02-28 DIAGNOSIS — E782 Mixed hyperlipidemia: Secondary | ICD-10-CM | POA: Diagnosis not present

## 2024-02-28 DIAGNOSIS — E663 Overweight: Secondary | ICD-10-CM | POA: Diagnosis not present

## 2024-02-28 DIAGNOSIS — R748 Abnormal levels of other serum enzymes: Secondary | ICD-10-CM | POA: Diagnosis not present

## 2024-02-28 DIAGNOSIS — D649 Anemia, unspecified: Secondary | ICD-10-CM | POA: Diagnosis not present

## 2024-02-28 DIAGNOSIS — R7303 Prediabetes: Secondary | ICD-10-CM | POA: Diagnosis not present

## 2024-02-28 DIAGNOSIS — E049 Nontoxic goiter, unspecified: Secondary | ICD-10-CM | POA: Diagnosis not present

## 2024-02-28 DIAGNOSIS — G4733 Obstructive sleep apnea (adult) (pediatric): Secondary | ICD-10-CM | POA: Diagnosis not present

## 2024-02-28 DIAGNOSIS — M791 Myalgia, unspecified site: Secondary | ICD-10-CM | POA: Diagnosis not present

## 2024-03-31 ENCOUNTER — Encounter: Payer: Self-pay | Admitting: Family Medicine

## 2024-04-02 ENCOUNTER — Other Ambulatory Visit: Payer: Self-pay | Admitting: Family Medicine

## 2024-04-02 DIAGNOSIS — R7301 Impaired fasting glucose: Secondary | ICD-10-CM

## 2024-04-04 ENCOUNTER — Encounter: Payer: Self-pay | Admitting: Family Medicine

## 2024-04-04 ENCOUNTER — Ambulatory Visit: Admitting: Family Medicine

## 2024-04-04 ENCOUNTER — Ambulatory Visit: Payer: Self-pay | Admitting: Family Medicine

## 2024-04-04 VITALS — BP 150/90 | HR 83 | Temp 98.4°F | Resp 16 | Ht 65.0 in | Wt 155.0 lb

## 2024-04-04 DIAGNOSIS — Z23 Encounter for immunization: Secondary | ICD-10-CM | POA: Diagnosis not present

## 2024-04-04 DIAGNOSIS — Z0000 Encounter for general adult medical examination without abnormal findings: Secondary | ICD-10-CM | POA: Diagnosis not present

## 2024-04-04 DIAGNOSIS — R7301 Impaired fasting glucose: Secondary | ICD-10-CM

## 2024-04-04 DIAGNOSIS — M8589 Other specified disorders of bone density and structure, multiple sites: Secondary | ICD-10-CM | POA: Diagnosis not present

## 2024-04-04 DIAGNOSIS — E782 Mixed hyperlipidemia: Secondary | ICD-10-CM | POA: Diagnosis not present

## 2024-04-04 DIAGNOSIS — E559 Vitamin D deficiency, unspecified: Secondary | ICD-10-CM

## 2024-04-04 DIAGNOSIS — I10 Essential (primary) hypertension: Secondary | ICD-10-CM

## 2024-04-04 LAB — COMPREHENSIVE METABOLIC PANEL WITH GFR
ALT: 19 U/L (ref 3–35)
AST: 22 U/L (ref 5–37)
Albumin: 5 g/dL (ref 3.5–5.2)
Alkaline Phosphatase: 67 U/L (ref 39–117)
BUN: 12 mg/dL (ref 6–23)
CO2: 26 meq/L (ref 19–32)
Calcium: 10.2 mg/dL (ref 8.4–10.5)
Chloride: 104 meq/L (ref 96–112)
Creatinine, Ser: 0.72 mg/dL (ref 0.40–1.20)
GFR: 86.72 mL/min (ref >60.00)
Glucose, Bld: 94 mg/dL (ref 70–99)
Potassium: 4.4 meq/L (ref 3.5–5.1)
Sodium: 140 meq/L (ref 135–145)
Total Bilirubin: 0.5 mg/dL (ref 0.2–1.2)
Total Protein: 7.6 g/dL (ref 6.0–8.3)

## 2024-04-04 LAB — LIPID PANEL
Cholesterol: 253 mg/dL — ABNORMAL HIGH (ref 28–200)
HDL: 71 mg/dL (ref >39.00)
LDL Cholesterol: 155 mg/dL — ABNORMAL HIGH (ref 10–99)
NonHDL: 182.03
Total CHOL/HDL Ratio: 4
Triglycerides: 136 mg/dL (ref 10.0–149.0)
VLDL: 27.2 mg/dL (ref 0.0–40.0)

## 2024-04-04 LAB — TSH: TSH: 0.79 u[IU]/mL (ref 0.35–5.50)

## 2024-04-04 LAB — VITAMIN D 25 HYDROXY (VIT D DEFICIENCY, FRACTURES): VITD: 40.66 ng/mL (ref 30.00–100.00)

## 2024-04-04 MED ORDER — METFORMIN HCL 500 MG PO TABS: 250.0000 mg | ORAL_TABLET | Freq: Two times a day (BID) | ORAL | 2 refills | Status: AC

## 2024-04-04 MED ORDER — METFORMIN HCL 500 MG PO TABS: ORAL_TABLET | ORAL | 0 refills | Status: DC

## 2024-04-04 NOTE — Assessment & Plan Note (Signed)
 On Metformin  500 mg 1/2 tab bid. She can consider stopping med, it is low dose and can be contributing to nausea. She is also on Zepbound for wt loss.

## 2024-04-04 NOTE — Progress Notes (Signed)
 Chief Complaint  Patient presents with   Annual Exam   Discussed the use of AI scribe software for clinical note transcription with the patient, who gave verbal consent to proceed. History of Present Illness Laura Payne is a 68 year old female with PMHx significant for HTN, allergic rhinitis, IFG, goiter,HLD, vit D def, and high alcohol intake who presents for an annual physical exam and follow up.  She engages in regular physical activity, walking three times per week for 30 to 45 minutes.  Her diet primarily consists of home-cooked meals with daily vegetable intake.  She averages eight hours of sleep per night and uses an [employer] ring to monitor her sleep quality.  She consumes two to three glasses of wine nightly and acknowledges this exceeds recommended limits.  She quit smoking in 2015 and was never a heavy smoker. She sees her eye care provider and dentist regularly.  Immunization History  Administered Date(s) Administered   Fluzone Influenza virus vaccine,trivalent (IIV3), split virus 02/13/2010   Influenza Split 02/18/2009, 05/04/2011   Influenza,inj,Quad PF,6+ Mos 04/05/2013, 05/07/2014, 02/14/2015, 02/27/2016, 02/24/2018, 02/15/2019   Influenza-Unspecified 02/02/2020   PFIZER(Purple Top)SARS-COV-2 Vaccination 06/23/2019, 07/14/2019, 09/08/2019, 02/02/2020   Tdap 10/19/2007, 02/15/2019   Health Maintenance  Topic Date Due   Influenza Vaccine  11/19/2023   COVID-19 Vaccine (5 - 2025-26 season) 04/20/2024 (Originally 12/20/2023)   Zoster Vaccines- Shingrix (1 of 2) 07/03/2024 (Originally 03/06/2007)   Pneumococcal Vaccine: 50+ Years (1 of 1 - PCV) 04/04/2025 (Originally 03/06/2007)   Mammogram  04/04/2025 (Originally 06/04/2023)   Fecal DNA (Cologuard)  10/07/2026 (Originally 11/08/2021)   Medicare Annual Wellness (AWV)  08/04/2024   DTaP/Tdap/Td (3 - Td or Tdap) 02/14/2029   Bone Density Scan  Completed   Hepatitis C Screening  Completed   Meningococcal B Vaccine  Aged Out    Colonoscopy  Discontinued  Her last bone density scan in 2019 showed osteopenia.  HLD previously managed with rosuvastatin , which she stopped due to side effects. She was prescribed pravastatin  by her cardiologist but has not started it.  FLP on 11/17/2023: TC 279, HDL 74, LDL 185, and TG 120.  Lab Results  Component Value Date   CHOL 329 (H) 10/07/2022   HDL 76.80 10/07/2022   LDLCALC 116 (H) 05/28/2021   LDLDIRECT 222.0 10/07/2022   TRIG 205.0 (H) 10/07/2022   CHOLHDL 4 10/07/2022   Lab Results  Component Value Date   HGBA1C 5.6 10/07/2022   Hypertension: She has not been consistent with taking Lisinopril  40 mg daily. She monitor BP at home. BP home readings are generally good, around 135/70 mmHg.   Lab Results  Component Value Date   NA 141 12/14/2023   CL 104 12/14/2023   K 4.2 12/14/2023   CO2 24 12/14/2023   BUN 11 12/14/2023   CREATININE 0.69 12/14/2023   GFR 90.21 12/14/2023   CALCIUM  9.6 12/14/2023   ALBUMIN 4.8 12/14/2023   GLUCOSE 85 12/14/2023   She is currently on Zepbound for weight loss, having lost 30 pounds since 2020 but reports hair loss as a side effect. She has been inconsistent with her medication routine, particularly with vitamin D  supplementation, due to nausea when taking multiple pills at night.  Lab Results  Component Value Date   VD25OH 61.90 10/07/2022   IFG on Metformin  500 mg 1/2 tab bid. Lab Results  Component Value Date   HGBA1C 5.6 10/07/2022   Review of Systems  Constitutional:  Negative for activity change, appetite change and  fever.  HENT:  Negative for mouth sores, sore throat and trouble swallowing.   Eyes:  Negative for redness and visual disturbance.  Respiratory:  Negative for cough, shortness of breath and wheezing.   Cardiovascular:  Negative for chest pain and leg swelling.  Gastrointestinal:  Positive for nausea. Negative for abdominal pain and vomiting.  Endocrine: Negative for cold intolerance, heat intolerance,  polydipsia, polyphagia and polyuria.  Genitourinary:  Negative for decreased urine volume, dysuria, hematuria, vaginal bleeding and vaginal discharge.  Musculoskeletal:  Positive for arthralgias and back pain. Negative for gait problem and myalgias.  Skin:  Negative for color change and rash.  Allergic/Immunologic: Positive for environmental allergies.  Neurological:  Negative for syncope, weakness and headaches.  Hematological:  Negative for adenopathy. Does not bruise/bleed easily.  Psychiatric/Behavioral:  Negative for confusion. The patient is not nervous/anxious.   All other systems reviewed and are negative.  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   Partial hamstring tear 03/2016   Sleep apnea    Suspected sleep apnea    never assessed/evaluated, but using SO's CPAP or BiPAP machine for years and feels better. Noted by MD only in 07/2019   Past Surgical History:  Procedure Laterality Date   DILATION AND CURETTAGE OF UTERUS     TONSILLECTOMY     Allergies[2]  Family History  Problem Relation Age of Onset   Osteoporosis Mother    Dementia Mother    Transient ischemic attack Mother    Hypertension Mother    Heart disease Mother        atrial fibrillation   Atrial fibrillation Mother    Ovarian cancer Mother    Cancer Mother        ovarian   Cancer Father 89       osteosarcoma, metastasized to lung   Early death Father    Crohn's disease Cousin    Psoriasis Cousin     Diabetes Maternal Uncle    Cancer Paternal Uncle 44       colon   Cancer Maternal Grandfather        throat   Breast cancer Paternal Grandmother 79   Arthritis Cousin        rheumatoid   Diabetes Cousin        type 1   Breast cancer Cousin        and uterine cancer--Lynch syndrome    Social History   Socioeconomic History   Marital status: Divorced    Spouse name: Not on file   Number of children: 1   Years of education: Not on file   Highest education level: Master's degree (e.g., MA, MS, MEng, MEd, MSW, MBA)  Occupational History   Occupation: Scientist, Research (life Sciences): [employer]  Tobacco Use   Smoking status: Former    Current packs/day: 0.00    Average packs/day: 0.1 packs/day for  41.0 years (4.1 ttl pk-yrs)    Types: Cigarettes    Start date: 10/07/1972    Quit date: 10/07/2013    Years since quitting: 10.4   Smokeless tobacco: Never  Vaping Use   Vaping status: Never Used  Substance and Sexual Activity   Alcohol use: Yes    Alcohol/week: 14.0 standard drinks of alcohol    Types: 14 Glasses of wine per week    Comment: 2-3 glasses daily   Drug use: No   Sexual activity: Yes    Partners: Male    Birth control/protection: Post-menopausal  Other Topics Concern   Not on file  Social History Narrative   Has 1 son.   No pets.   Architect. 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    Monogamous, committed relationship.   [place] the [place]days through Tuesdays each week   Social Drivers of Health   Tobacco Use: Medium Risk (04/04/2024)   Patient History    Smoking Tobacco Use: Former    Smokeless Tobacco Use: Never    Passive Exposure: Not on file  Financial Resource Strain: Low Risk (03/28/2024)   Overall Financial Resource Strain (CARDIA)    Difficulty of Paying Living Expenses: Not hard at all  Food Insecurity: No Food Insecurity (03/28/2024)   Epic    Worried About Programme Researcher, Broadcasting/film/video in the Last Year:  Never true    Ran Out of Food in the Last Year: Never true  Transportation Needs: No Transportation Needs (03/28/2024)   Epic    Lack of Transportation (Medical): No    Lack of Transportation (Non-Medical): No  Physical Activity: Insufficiently Active (03/28/2024)   Exercise Vital Sign    Days of Exercise per Week: 3 days    Minutes of Exercise per Session: 30 min  Stress: No Stress Concern Present (03/28/2024)   [employer] of Occupational Health - Occupational Stress Questionnaire    Feeling of Stress: Only a little  Social Connections: Moderately Isolated (03/28/2024)   Social Connection and Isolation Panel    Frequency of Communication with Friends and Family: Three times a week    Frequency of Social Gatherings with Friends and Family: Once a week    Attends Religious Services: Never    Database Administrator or Organizations: No    Attends Engineer, Structural: Not on file    Marital Status: Living with partner  Depression (PHQ2-9): Low Risk (04/04/2024)   Depression (PHQ2-9)    PHQ-2 Score: 0  Alcohol Screen: Low Risk (03/28/2024)   Alcohol Screen    Last Alcohol Screening Score (AUDIT): 5  Housing: Low Risk (03/28/2024)   Epic    Unable to Pay for Housing in the Last Year: No    Number of Times Moved in the Last Year: 0    Homeless in the Last Year: No  Utilities: Not on file  Health Literacy: Not on file   Today's Vitals   04/04/24 0946 04/04/24 1046  BP: (!) 158/90 (!) 150/90  Pulse: 83   Resp: 16   Temp: 98.4 F (36.9 C)   TempSrc: Oral   SpO2: 95%   Weight: 155 lb (70.3 kg)   Height: 5' 5 (1.651 m)    Body mass index is 25.79 kg/m.  Wt Readings from Last 3 Encounters:  04/04/24 155 lb (70.3 kg)  12/14/23 156 lb 4.8 oz (70.9 kg)  05/18/23 177 lb (80.3 kg)   Physical Exam  Vitals and nursing note reviewed.  Constitutional:      General: She is not in acute distress.    Appearance: She is well-developed.  HENT:     Head: Normocephalic and  atraumatic.     Right Ear: Hearing, tympanic membrane, ear canal and external ear normal.     Left Ear: Hearing, tympanic membrane, ear canal and external ear normal.     Mouth/Throat:     Mouth: Mucous membranes are moist.     Pharynx: Oropharynx is clear. Uvula midline.  Eyes:     Extraocular Movements: Extraocular movements intact.     Conjunctiva/sclera: Conjunctivae normal.     Pupils: Pupils are equal, round, and reactive to light.  Neck:     Thyroid : Thyromegaly (mild) present. No thyroid  mass.  Cardiovascular:     Rate and Rhythm: Normal rate and regular rhythm.     Pulses:          Dorsalis pedis pulses are 2+ on the right side and 2+ on the left side.     Heart sounds: No murmur heard. Pulmonary:     Effort: Pulmonary effort is normal. No respiratory distress.     Breath sounds: Normal breath sounds.  Abdominal:     Palpations: Abdomen is soft. There is no hepatomegaly or mass.     Tenderness: There is no abdominal tenderness.  Genitourinary:    Comments: No concerns today. Musculoskeletal:     Right lower leg: No edema.     Left lower leg: No edema.     Comments: No signs of synovitis appreciated.  Lymphadenopathy:     Cervical: No cervical adenopathy.     Upper Body:     Right upper body: No supraclavicular adenopathy.     Left upper body: No supraclavicular adenopathy.  Skin:    General: Skin is warm.     Findings: No erythema or rash.  Neurological:     General: No focal deficit present.     Mental Status: She is alert and oriented to person, place, and time.     Cranial Nerves: No cranial nerve deficit.     Coordination: Coordination normal.     Gait: Gait normal.     Deep Tendon Reflexes:     Reflex Scores:      Bicep reflexes are 2+ on the right side and 2+ on the left side.      Patellar reflexes are 2+ on the right side and 2+ on the left side. Psychiatric:        Mood and Affect: Mood and affect normal.   ASSESSMENT AND PLAN:  Laura Payne was here today annual physical examination and follow up.  Orders Placed This Encounter  Procedures   Flu vaccine HIGH DOSE PF(Fluzone Trivalent)   Pneumococcal conjugate vaccine 20-valent (Prevnar 20)   Lipid panel   VITAMIN D  25 Hydroxy (Vit-D Deficiency, Fractures)   Comprehensive metabolic panel with GFR   TSH   Lab Results  Component Value Date   TSH 0.79 04/04/2024   Lab Results  Component Value Date   CHOL 253 (H) 04/04/2024   HDL 71.00 04/04/2024   LDLCALC 155 (H) 04/04/2024   LDLDIRECT 222.0 10/07/2022   TRIG 136.0 04/04/2024   CHOLHDL 4 04/04/2024   Lab Results  Component Value Date   VD25OH 40.66 04/04/2024   Lab Results  Component Value Date   NA 140 04/04/2024   CL 104 04/04/2024   K 4.4 04/04/2024  CO2 26 04/04/2024   BUN 12 04/04/2024   CREATININE 0.72 04/04/2024   GFR 86.72 04/04/2024   CALCIUM  10.2 04/04/2024   ALBUMIN 5.0 04/04/2024   GLUCOSE 94 04/04/2024   Lab Results  Component Value Date   ALT 19 04/04/2024   AST 22 04/04/2024   ALKPHOS 67 04/04/2024   BILITOT 0.5 04/04/2024   Routine general medical examination at a health care facility Assessment & Plan: We discussed the importance of regular physical activity and healthy diet for prevention of chronic illness and/or complications. Preventive guidelines reviewed. Vaccination: Prevnar 20 and flu vaccine given today. Plannin gon getting shingrix in 2-3 weeks. Ca++ and vit D supplementation recommended. Fall precautions discussed. Decrease alcohol intake. DEXA ordered. Next CPE in a year.   Essential hypertension, benign Assessment & Plan: BP is not adequately controlled. Possible complications discussed. Stressed the importance of compliance with taking medication. Reporting lower BP readings at home. Continue Lisinopril  40 mg daily and low salt diet. Follows with cardiologist.  Orders: -     Comprehensive metabolic panel with GFR; Future -     TSH; Future  Mixed  hyperlipidemia Assessment & Plan: She did not tolerate Rosuvastatin . Her cardiologist has recommended Pravastatin  but she has not started it yet. Would like lipid panel check today.  Orders: -     Lipid panel; Future -     Comprehensive metabolic panel with GFR; Future  Vitamin D  deficiency, unspecified Assessment & Plan: Has not been on vit D supplementation consistently. Further recommendations according to 25 OH vit D result.  Orders: -     VITAMIN D  25 Hydroxy (Vit-D Deficiency, Fractures); Future -     Comprehensive metabolic panel with GFR; Future  Osteopenia of multiple sites Assessment & Plan: Adequate calcium  and vit D supplementation recommended as well as fall precautions, weightbearing exercises 2-3 times per week. DEXA will be arranged.  Orders: -     DG Bone Density; Future  Impaired fasting glucose Assessment & Plan: On Metformin  500 mg 1/2 tab bid. She can consider stopping med, it is low dose and can be contributing to nausea. She is also on Zepbound for wt loss.  Orders: -     metFORMIN  HCl; Take 0.5 tablets (250 mg total) by mouth 2 (two) times daily with a meal. TAKE ONE-HALF TABLET(250 MG) BY MOUTH TWICE DAILY WITH A MEAL  Dispense: 90 tablet; Refill: 2  Immunization due -     Flu vaccine HIGH DOSE PF(Fluzone Trivalent) -     Pneumococcal conjugate vaccine 20-valent    Return in 1 year (on 04/04/2025).  Laura Lyter G. Shaunte Tuft, MD  [facility]. [place] office.     [1]  Current Outpatient Medications on File Prior to Visit  Medication Sig Dispense Refill   Cholecalciferol (VITAMIN D ) 2000 units CAPS Take 1 capsule by mouth.     Coenzyme Q10 (COQ10 PO) Take 1 capsule by mouth daily.     esomeprazole  (NEXIUM ) 20 MG capsule Take 40 mg by mouth daily at 12 noon.     ibuprofen (ADVIL,MOTRIN) 200 MG tablet Take 400 mg by mouth every 6 (six) hours as needed.     lisinopril  (ZESTRIL ) 40 MG tablet TAKE 1 TABLET BY MOUTH DAILY 90 tablet 2    metFORMIN  (GLUCOPHAGE ) 500 MG tablet TAKE ONE-HALF TABLET(250 MG) BY MOUTH TWICE DAILY WITH A MEAL 90 tablet 2   pravastatin  (PRAVACHOL ) 20 MG tablet Take 1 tablet (20 mg total) by mouth daily. 90 tablet 1  tirzepatide 5 MG/0.5ML injection vial Inject 5 mg into the skin once a week.     vitamin B-12 (CYANOCOBALAMIN) 1000 MCG tablet Take 1,000 mcg by mouth daily.     No current facility-administered medications on file prior to visit.  [2]  Allergies Allergen Reactions   Codeine Other (See Comments)    Would make her start her period. Also just affected her body poorly so she just does not take it.   Hctz [Hydrochlorothiazide] Other (See Comments)    Hyponatremia    Latex Rash   Penicillins Rash

## 2024-04-04 NOTE — Assessment & Plan Note (Addendum)
 We discussed the importance of regular physical activity and healthy diet for prevention of chronic illness and/or complications. Preventive guidelines reviewed. Vaccination: Prevnar 20 and flu vaccine given today. Plannin gon getting shingrix in 2-3 weeks. Ca++ and vit D supplementation recommended. Fall precautions discussed. Decrease alcohol intake. DEXA ordered. Next CPE in a year.

## 2024-04-04 NOTE — Assessment & Plan Note (Signed)
 She did not tolerate Rosuvastatin . Her cardiologist has recommended Pravastatin  but she has not started it yet. Would like lipid panel check today.

## 2024-04-04 NOTE — Patient Instructions (Addendum)
 A few things to remember from today's visit:  Essential hypertension, benign - Plan: Comprehensive metabolic panel with GFR, TSH  Mixed hyperlipidemia - Plan: Lipid panel, Comprehensive metabolic panel with GFR  Vitamin D  deficiency, unspecified - Plan: VITAMIN D  25 Hydroxy (Vit-D Deficiency, Fractures), Comprehensive metabolic panel with GFR  Immunization due - Plan: Flu vaccine HIGH DOSE PF(Fluzone Trivalent), Pneumococcal conjugate vaccine 20-valent (Prevnar 20)  Try to take blood pressure medication daily. Consider starting Pravastatin  for your cholesterol. Decrease alcohol intake.  If you need refills for medications you take chronically, please call your pharmacy. Do not use My Chart to request refills or for acute issues that need immediate attention. If you send a my chart message, it may take a few days to be addressed, specially if I am not in the office.  Please be sure medication list is accurate. If a new problem present, please set up appointment sooner than planned today.

## 2024-04-04 NOTE — Assessment & Plan Note (Signed)
 Has not been on vit D supplementation consistently. Further recommendations according to 25 OH vit D result.

## 2024-04-04 NOTE — Assessment & Plan Note (Addendum)
 BP is not adequately controlled. Possible complications discussed. Stressed the importance of compliance with taking medication. Reporting lower BP readings at home. Continue Lisinopril  40 mg daily and low salt diet. Follows with cardiologist.

## 2024-04-04 NOTE — Assessment & Plan Note (Signed)
 Adequate calcium  and vit D supplementation recommended as well as fall precautions, weightbearing exercises 2-3 times per week. DEXA will be arranged.
